# Patient Record
Sex: Female | Born: 1945 | Race: White | Hispanic: No | Marital: Single | Smoking: Never smoker
Health system: Southern US, Community
[De-identification: ages and names within clinical notes are randomized; demographics above are authoritative.]

## PROBLEM LIST (undated history)

## (undated) DIAGNOSIS — T7840XA Allergy, unspecified, initial encounter: Secondary | ICD-10-CM

## (undated) DIAGNOSIS — C801 Malignant (primary) neoplasm, unspecified: Secondary | ICD-10-CM

## (undated) DIAGNOSIS — K921 Melena: Secondary | ICD-10-CM

## (undated) DIAGNOSIS — Z923 Personal history of irradiation: Secondary | ICD-10-CM

## (undated) DIAGNOSIS — Z9221 Personal history of antineoplastic chemotherapy: Secondary | ICD-10-CM

## (undated) HISTORY — DX: Allergy, unspecified, initial encounter: T78.40XA

## (undated) HISTORY — DX: Melena: K92.1

## (undated) HISTORY — DX: Malignant (primary) neoplasm, unspecified: C80.1

---

## 2001-08-25 ENCOUNTER — Other Ambulatory Visit: Admission: RE | Admit: 2001-08-25 | Discharge: 2001-08-25 | Payer: Self-pay | Admitting: Obstetrics and Gynecology

## 2001-11-10 ENCOUNTER — Other Ambulatory Visit: Admission: RE | Admit: 2001-11-10 | Discharge: 2001-11-10 | Payer: Self-pay | Admitting: Obstetrics and Gynecology

## 2001-11-27 ENCOUNTER — Encounter (INDEPENDENT_AMBULATORY_CARE_PROVIDER_SITE_OTHER): Payer: Self-pay

## 2001-11-27 ENCOUNTER — Ambulatory Visit (HOSPITAL_COMMUNITY): Admission: RE | Admit: 2001-11-27 | Discharge: 2001-11-27 | Payer: Self-pay | Admitting: Obstetrics and Gynecology

## 2002-04-14 ENCOUNTER — Other Ambulatory Visit: Admission: RE | Admit: 2002-04-14 | Discharge: 2002-04-14 | Payer: Self-pay | Admitting: Obstetrics and Gynecology

## 2002-10-26 ENCOUNTER — Other Ambulatory Visit: Admission: RE | Admit: 2002-10-26 | Discharge: 2002-10-26 | Payer: Self-pay | Admitting: Obstetrics and Gynecology

## 2002-12-10 DIAGNOSIS — C801 Malignant (primary) neoplasm, unspecified: Secondary | ICD-10-CM

## 2002-12-10 HISTORY — DX: Malignant (primary) neoplasm, unspecified: C80.1

## 2002-12-10 HISTORY — PX: MASTECTOMY: SHX3

## 2003-03-02 ENCOUNTER — Encounter: Payer: Self-pay | Admitting: Obstetrics and Gynecology

## 2003-03-02 ENCOUNTER — Encounter (INDEPENDENT_AMBULATORY_CARE_PROVIDER_SITE_OTHER): Payer: Self-pay | Admitting: Specialist

## 2003-03-02 ENCOUNTER — Encounter: Admission: RE | Admit: 2003-03-02 | Discharge: 2003-03-02 | Payer: Self-pay | Admitting: Obstetrics and Gynecology

## 2003-03-02 ENCOUNTER — Other Ambulatory Visit: Admission: RE | Admit: 2003-03-02 | Discharge: 2003-03-02 | Payer: Self-pay | Admitting: Diagnostic Radiology

## 2003-03-05 ENCOUNTER — Encounter: Payer: Self-pay | Admitting: General Surgery

## 2003-03-05 ENCOUNTER — Encounter (HOSPITAL_COMMUNITY): Admission: RE | Admit: 2003-03-05 | Discharge: 2003-06-03 | Payer: Self-pay | Admitting: General Surgery

## 2003-03-08 ENCOUNTER — Encounter: Payer: Self-pay | Admitting: General Surgery

## 2003-03-12 ENCOUNTER — Encounter: Admission: RE | Admit: 2003-03-12 | Discharge: 2003-03-12 | Payer: Self-pay | Admitting: General Surgery

## 2003-03-12 ENCOUNTER — Encounter: Payer: Self-pay | Admitting: General Surgery

## 2003-03-15 ENCOUNTER — Encounter (INDEPENDENT_AMBULATORY_CARE_PROVIDER_SITE_OTHER): Payer: Self-pay | Admitting: *Deleted

## 2003-03-15 ENCOUNTER — Encounter: Payer: Self-pay | Admitting: General Surgery

## 2003-03-15 ENCOUNTER — Ambulatory Visit (HOSPITAL_BASED_OUTPATIENT_CLINIC_OR_DEPARTMENT_OTHER): Admission: RE | Admit: 2003-03-15 | Discharge: 2003-03-15 | Payer: Self-pay | Admitting: General Surgery

## 2003-03-22 ENCOUNTER — Ambulatory Visit (HOSPITAL_BASED_OUTPATIENT_CLINIC_OR_DEPARTMENT_OTHER): Admission: RE | Admit: 2003-03-22 | Discharge: 2003-03-23 | Payer: Self-pay | Admitting: General Surgery

## 2003-03-22 ENCOUNTER — Encounter (INDEPENDENT_AMBULATORY_CARE_PROVIDER_SITE_OTHER): Payer: Self-pay | Admitting: Specialist

## 2003-04-07 ENCOUNTER — Encounter: Payer: Self-pay | Admitting: Oncology

## 2003-04-07 ENCOUNTER — Ambulatory Visit (HOSPITAL_COMMUNITY): Admission: RE | Admit: 2003-04-07 | Discharge: 2003-04-07 | Payer: Self-pay | Admitting: Oncology

## 2003-04-12 ENCOUNTER — Ambulatory Visit (HOSPITAL_COMMUNITY): Admission: RE | Admit: 2003-04-12 | Discharge: 2003-04-12 | Payer: Self-pay | Admitting: Oncology

## 2003-04-12 ENCOUNTER — Encounter: Payer: Self-pay | Admitting: Oncology

## 2003-04-12 ENCOUNTER — Encounter (INDEPENDENT_AMBULATORY_CARE_PROVIDER_SITE_OTHER): Payer: Self-pay | Admitting: *Deleted

## 2003-04-19 ENCOUNTER — Ambulatory Visit (HOSPITAL_BASED_OUTPATIENT_CLINIC_OR_DEPARTMENT_OTHER): Admission: RE | Admit: 2003-04-19 | Discharge: 2003-04-19 | Payer: Self-pay | Admitting: General Surgery

## 2003-04-19 ENCOUNTER — Encounter: Payer: Self-pay | Admitting: General Surgery

## 2003-04-23 ENCOUNTER — Inpatient Hospital Stay (HOSPITAL_COMMUNITY): Admission: RE | Admit: 2003-04-23 | Discharge: 2003-04-26 | Payer: Self-pay | Admitting: Oncology

## 2003-04-23 ENCOUNTER — Encounter: Payer: Self-pay | Admitting: Surgery

## 2003-04-23 ENCOUNTER — Encounter: Payer: Self-pay | Admitting: Oncology

## 2003-04-24 ENCOUNTER — Encounter: Payer: Self-pay | Admitting: Surgery

## 2003-04-25 ENCOUNTER — Encounter: Payer: Self-pay | Admitting: Surgery

## 2003-04-26 ENCOUNTER — Encounter: Payer: Self-pay | Admitting: Surgery

## 2003-04-28 ENCOUNTER — Encounter: Payer: Self-pay | Admitting: General Surgery

## 2003-04-28 ENCOUNTER — Ambulatory Visit (HOSPITAL_COMMUNITY): Admission: RE | Admit: 2003-04-28 | Discharge: 2003-04-28 | Payer: Self-pay | Admitting: General Surgery

## 2003-05-03 ENCOUNTER — Encounter: Payer: Self-pay | Admitting: Oncology

## 2003-05-03 ENCOUNTER — Ambulatory Visit (HOSPITAL_COMMUNITY): Admission: RE | Admit: 2003-05-03 | Discharge: 2003-05-03 | Payer: Self-pay | Admitting: Oncology

## 2003-06-11 ENCOUNTER — Encounter (HOSPITAL_COMMUNITY): Admission: RE | Admit: 2003-06-11 | Discharge: 2003-09-09 | Payer: Self-pay | Admitting: Oncology

## 2003-06-17 ENCOUNTER — Ambulatory Visit (HOSPITAL_COMMUNITY): Admission: RE | Admit: 2003-06-17 | Discharge: 2003-06-17 | Payer: Self-pay | Admitting: Oncology

## 2003-06-17 ENCOUNTER — Encounter: Payer: Self-pay | Admitting: Oncology

## 2003-06-18 ENCOUNTER — Inpatient Hospital Stay (HOSPITAL_COMMUNITY): Admission: EM | Admit: 2003-06-18 | Discharge: 2003-06-22 | Payer: Self-pay | Admitting: Oncology

## 2003-06-20 ENCOUNTER — Encounter: Payer: Self-pay | Admitting: Oncology

## 2003-06-21 ENCOUNTER — Encounter: Payer: Self-pay | Admitting: Oncology

## 2003-07-19 ENCOUNTER — Ambulatory Visit (HOSPITAL_COMMUNITY): Admission: RE | Admit: 2003-07-19 | Discharge: 2003-07-19 | Payer: Self-pay | Admitting: Oncology

## 2003-07-19 ENCOUNTER — Encounter: Payer: Self-pay | Admitting: Oncology

## 2003-10-22 ENCOUNTER — Ambulatory Visit: Admission: RE | Admit: 2003-10-22 | Discharge: 2004-01-07 | Payer: Self-pay | Admitting: *Deleted

## 2004-01-21 ENCOUNTER — Ambulatory Visit (HOSPITAL_COMMUNITY): Admission: RE | Admit: 2004-01-21 | Discharge: 2004-01-21 | Payer: Self-pay | Admitting: Oncology

## 2004-02-04 ENCOUNTER — Ambulatory Visit: Admission: RE | Admit: 2004-02-04 | Discharge: 2004-02-04 | Payer: Self-pay | Admitting: *Deleted

## 2004-02-09 ENCOUNTER — Other Ambulatory Visit: Admission: RE | Admit: 2004-02-09 | Discharge: 2004-02-09 | Payer: Self-pay | Admitting: Obstetrics and Gynecology

## 2004-02-18 ENCOUNTER — Ambulatory Visit: Admission: RE | Admit: 2004-02-18 | Discharge: 2004-02-18 | Payer: Self-pay | Admitting: *Deleted

## 2004-03-02 ENCOUNTER — Encounter: Admission: RE | Admit: 2004-03-02 | Discharge: 2004-03-02 | Payer: Self-pay | Admitting: General Surgery

## 2004-04-04 ENCOUNTER — Encounter: Admission: RE | Admit: 2004-04-04 | Discharge: 2004-04-04 | Payer: Self-pay | Admitting: General Surgery

## 2004-08-15 ENCOUNTER — Ambulatory Visit (HOSPITAL_COMMUNITY): Admission: RE | Admit: 2004-08-15 | Discharge: 2004-08-15 | Payer: Self-pay | Admitting: Family Medicine

## 2005-01-18 ENCOUNTER — Ambulatory Visit: Payer: Self-pay | Admitting: Oncology

## 2005-03-21 ENCOUNTER — Other Ambulatory Visit: Admission: RE | Admit: 2005-03-21 | Discharge: 2005-03-21 | Payer: Self-pay | Admitting: Obstetrics and Gynecology

## 2005-03-28 ENCOUNTER — Encounter: Admission: RE | Admit: 2005-03-28 | Discharge: 2005-03-28 | Payer: Self-pay | Admitting: General Surgery

## 2005-06-18 ENCOUNTER — Ambulatory Visit: Payer: Self-pay | Admitting: Oncology

## 2005-06-21 ENCOUNTER — Ambulatory Visit (HOSPITAL_COMMUNITY): Admission: RE | Admit: 2005-06-21 | Discharge: 2005-06-21 | Payer: Self-pay | Admitting: Oncology

## 2005-06-22 ENCOUNTER — Ambulatory Visit (HOSPITAL_COMMUNITY): Admission: RE | Admit: 2005-06-22 | Discharge: 2005-06-22 | Payer: Self-pay | Admitting: Oncology

## 2005-08-31 ENCOUNTER — Ambulatory Visit: Payer: Self-pay | Admitting: Oncology

## 2005-09-05 ENCOUNTER — Ambulatory Visit (HOSPITAL_COMMUNITY): Admission: RE | Admit: 2005-09-05 | Discharge: 2005-09-05 | Payer: Self-pay | Admitting: Oncology

## 2005-11-15 ENCOUNTER — Encounter: Admission: RE | Admit: 2005-11-15 | Discharge: 2005-11-15 | Payer: Self-pay | Admitting: General Surgery

## 2006-03-15 ENCOUNTER — Ambulatory Visit: Payer: Self-pay | Admitting: Oncology

## 2006-03-18 LAB — COMPREHENSIVE METABOLIC PANEL
ALT: 18 U/L (ref 0–40)
AST: 22 U/L (ref 0–37)
Alkaline Phosphatase: 43 U/L (ref 39–117)
Glucose, Bld: 91 mg/dL (ref 70–99)
Sodium: 137 mEq/L (ref 135–145)
Total Bilirubin: 0.3 mg/dL (ref 0.3–1.2)
Total Protein: 7 g/dL (ref 6.0–8.3)

## 2006-03-18 LAB — CBC WITH DIFFERENTIAL/PLATELET
BASO%: 0.5 % (ref 0.0–2.0)
EOS%: 4.1 % (ref 0.0–7.0)
LYMPH%: 23.9 % (ref 14.0–48.0)
MCH: 31.4 pg (ref 26.0–34.0)
MCHC: 33 g/dL (ref 32.0–36.0)
MCV: 95.2 fL (ref 81.0–101.0)
MONO%: 10.2 % (ref 0.0–13.0)
Platelets: 256 10*3/uL (ref 145–400)
RBC: 3.68 10*6/uL — ABNORMAL LOW (ref 3.70–5.32)

## 2006-03-18 LAB — CANCER ANTIGEN 27.29: CA 27.29: 18 U/mL (ref 0–39)

## 2006-10-10 ENCOUNTER — Ambulatory Visit: Payer: Self-pay | Admitting: Oncology

## 2006-10-14 LAB — CBC WITH DIFFERENTIAL/PLATELET
EOS%: 3.5 % (ref 0.0–7.0)
Eosinophils Absolute: 0.2 10*3/uL (ref 0.0–0.5)
HGB: 11.7 g/dL (ref 11.6–15.9)
MCH: 32 pg (ref 26.0–34.0)
MCV: 95.4 fL (ref 81.0–101.0)
MONO%: 13.6 % — ABNORMAL HIGH (ref 0.0–13.0)
NEUT#: 3.1 10*3/uL (ref 1.5–6.5)
RBC: 3.66 10*6/uL — ABNORMAL LOW (ref 3.70–5.32)
RDW: 13 % (ref 11.3–14.5)
lymph#: 1.6 10*3/uL (ref 0.9–3.3)

## 2006-10-14 LAB — COMPREHENSIVE METABOLIC PANEL
ALT: 17 U/L (ref 0–35)
Albumin: 3.9 g/dL (ref 3.5–5.2)
Alkaline Phosphatase: 44 U/L (ref 39–117)
Potassium: 4.3 mEq/L (ref 3.5–5.3)
Sodium: 140 mEq/L (ref 135–145)
Total Bilirubin: 0.3 mg/dL (ref 0.3–1.2)
Total Protein: 6.6 g/dL (ref 6.0–8.3)

## 2006-10-14 LAB — CANCER ANTIGEN 27.29: CA 27.29: 13 U/mL (ref 0–39)

## 2006-11-08 ENCOUNTER — Ambulatory Visit (HOSPITAL_COMMUNITY): Admission: RE | Admit: 2006-11-08 | Discharge: 2006-11-08 | Payer: Self-pay | Admitting: Oncology

## 2006-11-18 ENCOUNTER — Encounter: Admission: RE | Admit: 2006-11-18 | Discharge: 2006-11-18 | Payer: Self-pay | Admitting: General Surgery

## 2007-04-10 ENCOUNTER — Ambulatory Visit: Payer: Self-pay | Admitting: Oncology

## 2007-04-14 LAB — COMPREHENSIVE METABOLIC PANEL
BUN: 11 mg/dL (ref 6–23)
CO2: 26 mEq/L (ref 19–32)
Calcium: 8.4 mg/dL (ref 8.4–10.5)
Chloride: 107 mEq/L (ref 96–112)
Creatinine, Ser: 0.72 mg/dL (ref 0.40–1.20)
Glucose, Bld: 125 mg/dL — ABNORMAL HIGH (ref 70–99)

## 2007-04-14 LAB — CBC WITH DIFFERENTIAL/PLATELET
Basophils Absolute: 0 10*3/uL (ref 0.0–0.1)
HCT: 32.7 % — ABNORMAL LOW (ref 34.8–46.6)
HGB: 11.2 g/dL — ABNORMAL LOW (ref 11.6–15.9)
MONO#: 0.6 10*3/uL (ref 0.1–0.9)
NEUT%: 59.8 % (ref 39.6–76.8)
Platelets: 238 10*3/uL (ref 145–400)
WBC: 5.3 10*3/uL (ref 3.9–10.0)
lymph#: 1.3 10*3/uL (ref 0.9–3.3)

## 2007-04-14 LAB — CANCER ANTIGEN 27.29: CA 27.29: 19 U/mL (ref 0–39)

## 2007-10-09 ENCOUNTER — Ambulatory Visit: Payer: Self-pay | Admitting: Oncology

## 2007-10-13 LAB — COMPREHENSIVE METABOLIC PANEL
Alkaline Phosphatase: 46 U/L (ref 39–117)
BUN: 18 mg/dL (ref 6–23)
CO2: 25 mEq/L (ref 19–32)
Creatinine, Ser: 0.99 mg/dL (ref 0.40–1.20)
Glucose, Bld: 92 mg/dL (ref 70–99)
Total Bilirubin: 0.3 mg/dL (ref 0.3–1.2)
Total Protein: 7.1 g/dL (ref 6.0–8.3)

## 2007-10-13 LAB — FOLLICLE STIMULATING HORMONE: FSH: 24.1 m[IU]/mL

## 2007-10-13 LAB — CBC WITH DIFFERENTIAL/PLATELET
Basophils Absolute: 0 10*3/uL (ref 0.0–0.1)
EOS%: 3 % (ref 0.0–7.0)
Eosinophils Absolute: 0.2 10*3/uL (ref 0.0–0.5)
HGB: 11.9 g/dL (ref 11.6–15.9)
MCH: 32.4 pg (ref 26.0–34.0)
NEUT#: 3.2 10*3/uL (ref 1.5–6.5)
RDW: 12.6 % (ref 11.3–14.5)
WBC: 5.7 10*3/uL (ref 3.9–10.0)
lymph#: 1.6 10*3/uL (ref 0.9–3.3)

## 2007-10-13 LAB — CANCER ANTIGEN 27.29: CA 27.29: 16 U/mL (ref 0–39)

## 2007-10-23 LAB — ESTRADIOL, ULTRA SENS: Estradiol, Ultra Sensitive: 7 pg/mL

## 2007-11-20 ENCOUNTER — Encounter: Admission: RE | Admit: 2007-11-20 | Discharge: 2007-11-20 | Payer: Self-pay | Admitting: General Surgery

## 2008-01-13 IMAGING — CR DG CHEST 2V
2 series · 2 of 2 positions shown · non-contrast
Comparison: 06/22/2005

CLINICAL DATA: Breast cancer, left scapular pain

CHEST - 2 VIEW:

[view not recorded (1 of 2)]
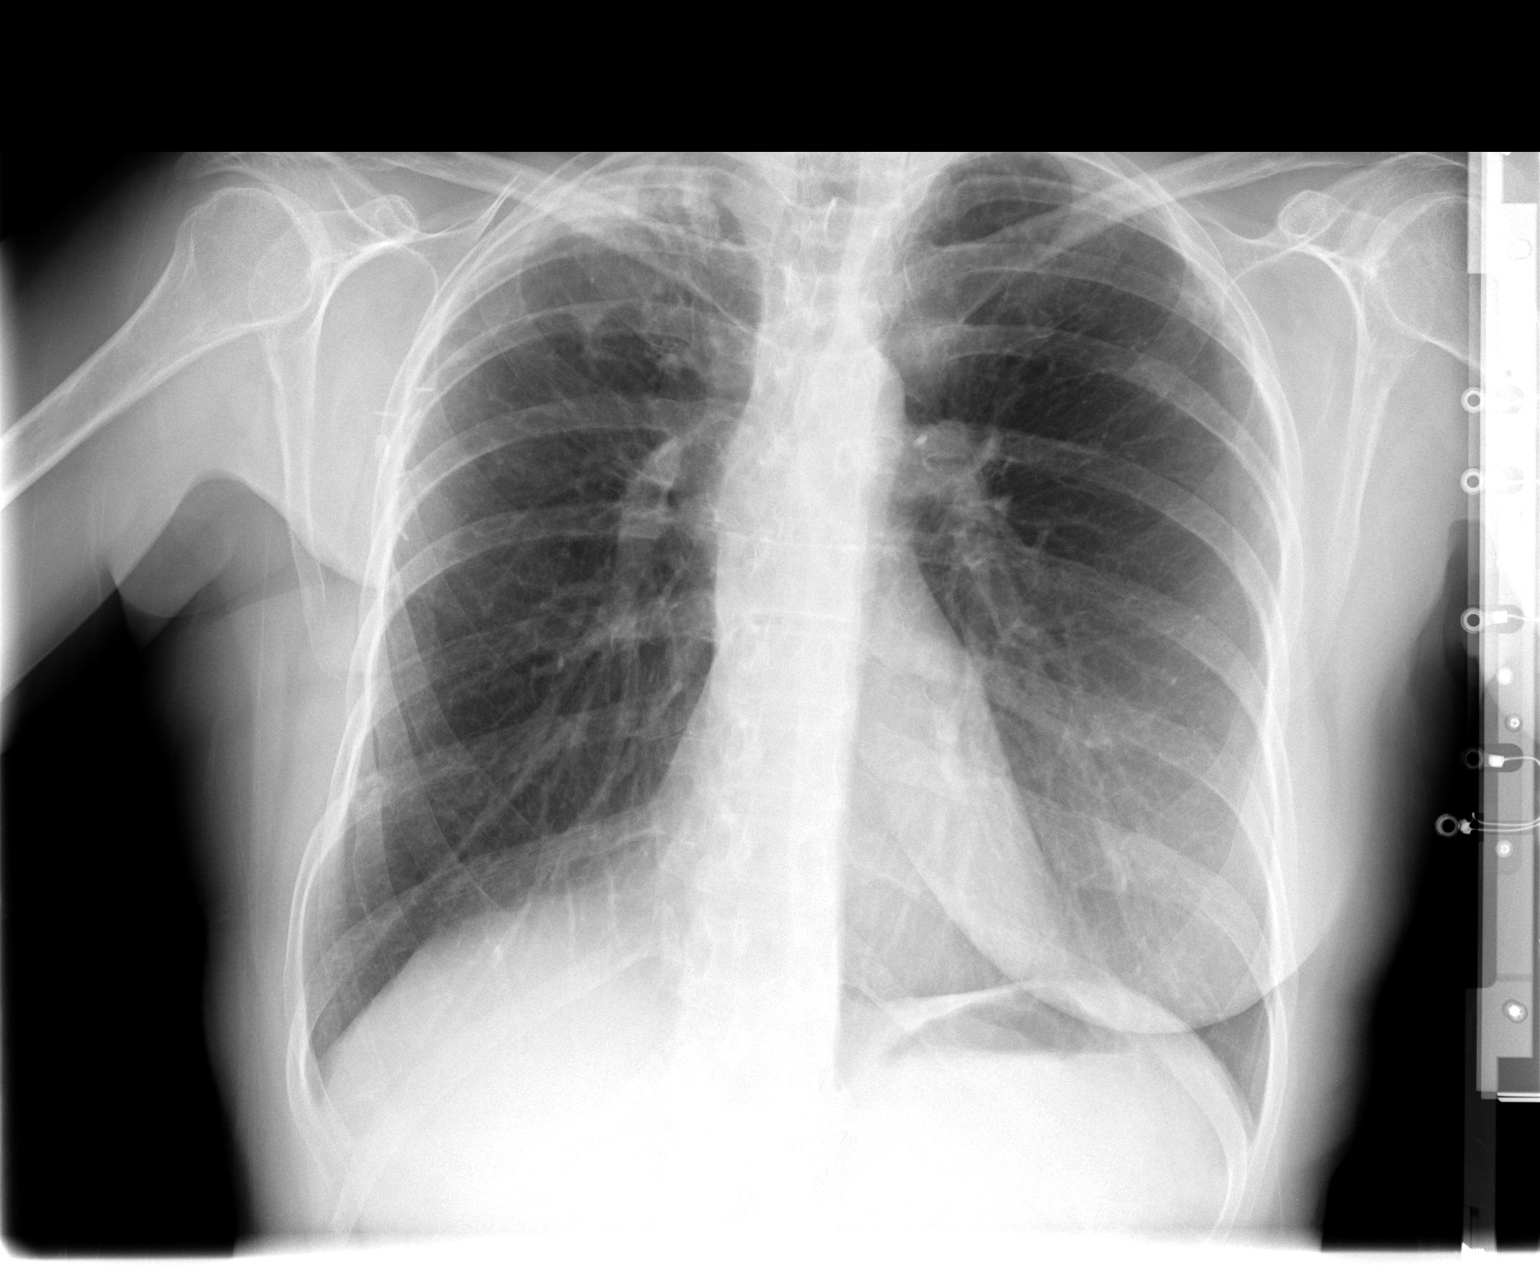

[view not recorded (2 of 2)]
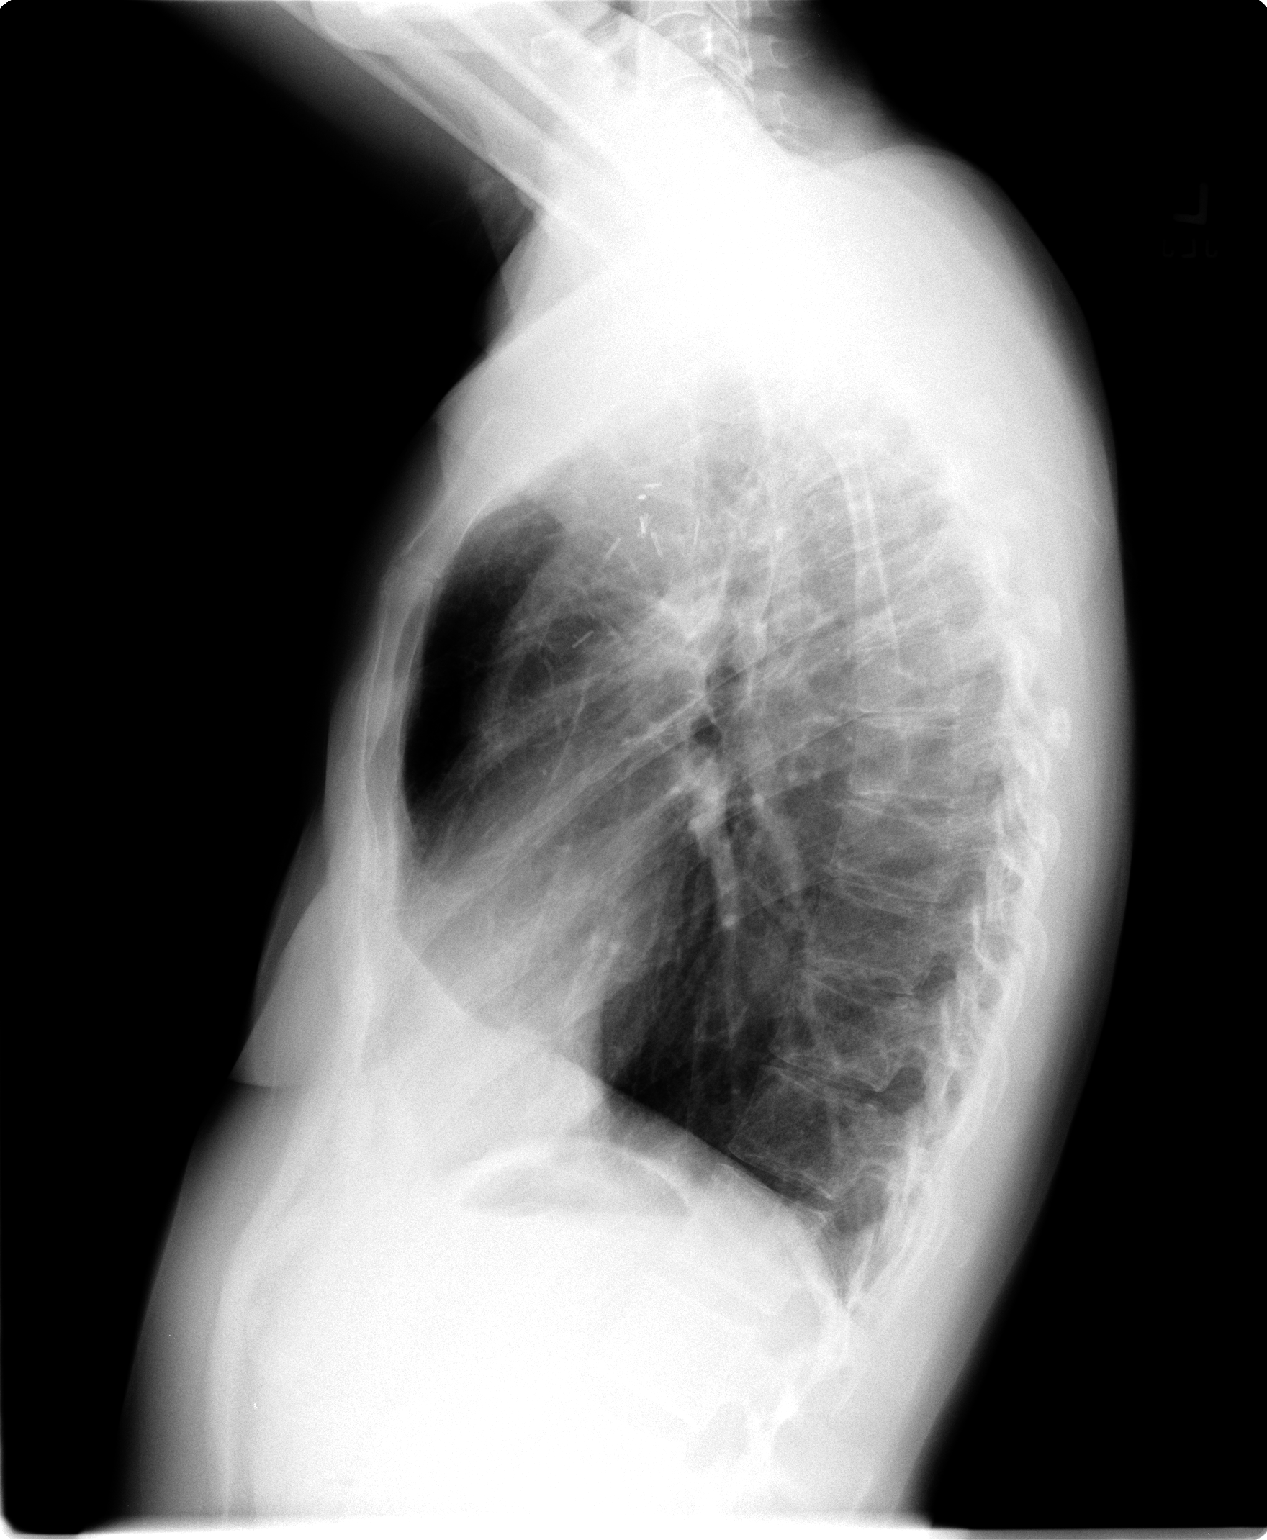

[2 of 2 positions shown; findings below may reference images not displayed]

FINDINGS: Heart and mediastinal contours are within normal limits. There is
stable right apical density, likely scarring. There appears to be mild
retraction of the hila superiorly. Surgical clips are noted in the right axilla.
No acute findings. Visualized to the area
IMPRESSION: Chronic changes. No acute findings.

## 2008-03-02 ENCOUNTER — Encounter (INDEPENDENT_AMBULATORY_CARE_PROVIDER_SITE_OTHER): Payer: Self-pay | Admitting: Family Medicine

## 2008-03-02 ENCOUNTER — Ambulatory Visit (HOSPITAL_COMMUNITY): Admission: RE | Admit: 2008-03-02 | Discharge: 2008-03-02 | Payer: Self-pay | Admitting: Obstetrics and Gynecology

## 2008-03-02 ENCOUNTER — Ambulatory Visit: Payer: Self-pay | Admitting: Vascular Surgery

## 2008-04-07 ENCOUNTER — Ambulatory Visit: Payer: Self-pay | Admitting: Oncology

## 2008-04-12 LAB — CBC WITH DIFFERENTIAL/PLATELET
Basophils Absolute: 0.1 10*3/uL (ref 0.0–0.1)
EOS%: 1.9 % (ref 0.0–7.0)
Eosinophils Absolute: 0.1 10*3/uL (ref 0.0–0.5)
HCT: 35.7 % (ref 34.8–46.6)
HGB: 12.2 g/dL (ref 11.6–15.9)
MCH: 32.3 pg (ref 26.0–34.0)
MCV: 94.7 fL (ref 81.0–101.0)
MONO%: 8.3 % (ref 0.0–13.0)
NEUT#: 4.4 10*3/uL (ref 1.5–6.5)
NEUT%: 61.8 % (ref 39.6–76.8)
Platelets: 274 10*3/uL (ref 145–400)
RDW: 12.7 % (ref 11.3–14.5)

## 2008-04-13 LAB — COMPREHENSIVE METABOLIC PANEL
AST: 21 U/L (ref 0–37)
Albumin: 4 g/dL (ref 3.5–5.2)
Alkaline Phosphatase: 39 U/L (ref 39–117)
BUN: 15 mg/dL (ref 6–23)
Calcium: 8.9 mg/dL (ref 8.4–10.5)
Creatinine, Ser: 1.03 mg/dL (ref 0.40–1.20)
Glucose, Bld: 115 mg/dL — ABNORMAL HIGH (ref 70–99)
Potassium: 4 mEq/L (ref 3.5–5.3)

## 2008-04-13 LAB — CANCER ANTIGEN 27.29: CA 27.29: 20 U/mL (ref 0–39)

## 2008-04-30 ENCOUNTER — Encounter: Admission: RE | Admit: 2008-04-30 | Discharge: 2008-04-30 | Payer: Self-pay | Admitting: Oncology

## 2008-10-14 ENCOUNTER — Ambulatory Visit: Payer: Self-pay | Admitting: Oncology

## 2008-10-18 LAB — CBC WITH DIFFERENTIAL/PLATELET
BASO%: 0.6 % (ref 0.0–2.0)
Basophils Absolute: 0 10*3/uL (ref 0.0–0.1)
EOS%: 4 % (ref 0.0–7.0)
HCT: 35.4 % (ref 34.8–46.6)
MCH: 32.2 pg (ref 26.0–34.0)
MCHC: 33.5 g/dL (ref 32.0–36.0)
MCV: 96.1 fL (ref 81.0–101.0)
MONO%: 10.1 % (ref 0.0–13.0)
NEUT%: 57.4 % (ref 39.6–76.8)
lymph#: 1.5 10*3/uL (ref 0.9–3.3)

## 2008-10-18 LAB — COMPREHENSIVE METABOLIC PANEL
ALT: 14 U/L (ref 0–35)
AST: 19 U/L (ref 0–37)
Alkaline Phosphatase: 49 U/L (ref 39–117)
BUN: 13 mg/dL (ref 6–23)
Chloride: 103 mEq/L (ref 96–112)
Creatinine, Ser: 0.8 mg/dL (ref 0.40–1.20)
Total Bilirubin: 0.3 mg/dL (ref 0.3–1.2)

## 2008-11-22 ENCOUNTER — Encounter: Admission: RE | Admit: 2008-11-22 | Discharge: 2008-11-22 | Payer: Self-pay | Admitting: Oncology

## 2009-03-25 ENCOUNTER — Ambulatory Visit: Payer: Self-pay | Admitting: Oncology

## 2009-03-29 LAB — CBC WITH DIFFERENTIAL/PLATELET
BASO%: 0.4 % (ref 0.0–2.0)
EOS%: 1.8 % (ref 0.0–7.0)
MCH: 31.8 pg (ref 25.1–34.0)
MCHC: 33.3 g/dL (ref 31.5–36.0)
MONO%: 10.4 % (ref 0.0–14.0)
RDW: 13.5 % (ref 11.2–14.5)
lymph#: 1.7 10*3/uL (ref 0.9–3.3)

## 2009-03-29 LAB — COMPREHENSIVE METABOLIC PANEL
ALT: 14 U/L (ref 0–35)
AST: 16 U/L (ref 0–37)
Albumin: 4.1 g/dL (ref 3.5–5.2)
Alkaline Phosphatase: 66 U/L (ref 39–117)
Calcium: 9.1 mg/dL (ref 8.4–10.5)
Chloride: 102 mEq/L (ref 96–112)
Creatinine, Ser: 1.01 mg/dL (ref 0.40–1.20)
Potassium: 4.1 mEq/L (ref 3.5–5.3)

## 2009-11-23 ENCOUNTER — Encounter: Admission: RE | Admit: 2009-11-23 | Discharge: 2009-11-23 | Payer: Self-pay | Admitting: Oncology

## 2010-03-27 ENCOUNTER — Ambulatory Visit: Payer: Self-pay | Admitting: Oncology

## 2010-03-29 LAB — CBC WITH DIFFERENTIAL/PLATELET
Basophils Absolute: 0 10*3/uL (ref 0.0–0.1)
Eosinophils Absolute: 0.2 10*3/uL (ref 0.0–0.5)
HGB: 12.1 g/dL (ref 11.6–15.9)
LYMPH%: 32.8 % (ref 14.0–49.7)
MCV: 94.9 fL (ref 79.5–101.0)
MONO%: 14.4 % — ABNORMAL HIGH (ref 0.0–14.0)
NEUT#: 3 10*3/uL (ref 1.5–6.5)
NEUT%: 49.3 % (ref 38.4–76.8)
Platelets: 295 10*3/uL (ref 145–400)

## 2010-03-30 LAB — COMPREHENSIVE METABOLIC PANEL
Albumin: 3.9 g/dL (ref 3.5–5.2)
Alkaline Phosphatase: 84 U/L (ref 39–117)
BUN: 15 mg/dL (ref 6–23)
Creatinine, Ser: 0.89 mg/dL (ref 0.40–1.20)
Glucose, Bld: 111 mg/dL — ABNORMAL HIGH (ref 70–99)
Total Bilirubin: 0.3 mg/dL (ref 0.3–1.2)

## 2010-03-30 LAB — VITAMIN D 25 HYDROXY (VIT D DEFICIENCY, FRACTURES): Vit D, 25-Hydroxy: 50 ng/mL (ref 30–89)

## 2010-11-27 ENCOUNTER — Encounter
Admission: RE | Admit: 2010-11-27 | Discharge: 2010-11-27 | Payer: Self-pay | Source: Home / Self Care | Attending: Oncology | Admitting: Oncology

## 2011-03-29 ENCOUNTER — Encounter (HOSPITAL_BASED_OUTPATIENT_CLINIC_OR_DEPARTMENT_OTHER): Payer: 59 | Admitting: Oncology

## 2011-03-29 ENCOUNTER — Other Ambulatory Visit: Payer: Self-pay | Admitting: Oncology

## 2011-03-29 DIAGNOSIS — C50419 Malignant neoplasm of upper-outer quadrant of unspecified female breast: Secondary | ICD-10-CM

## 2011-03-29 LAB — CBC WITH DIFFERENTIAL/PLATELET
BASO%: 0.6 % (ref 0.0–2.0)
HCT: 37 % (ref 34.8–46.6)
HGB: 11.8 g/dL (ref 11.6–15.9)
MCHC: 31.9 g/dL (ref 31.5–36.0)
MONO#: 0.6 10*3/uL (ref 0.1–0.9)
NEUT%: 52.8 % (ref 38.4–76.8)
WBC: 6.4 10*3/uL (ref 3.9–10.3)
lymph#: 2.2 10*3/uL (ref 0.9–3.3)

## 2011-03-30 LAB — COMPREHENSIVE METABOLIC PANEL
ALT: 16 U/L (ref 0–35)
Albumin: 4.2 g/dL (ref 3.5–5.2)
CO2: 24 mEq/L (ref 19–32)
Calcium: 9.3 mg/dL (ref 8.4–10.5)
Chloride: 104 mEq/L (ref 96–112)
Creatinine, Ser: 0.84 mg/dL (ref 0.40–1.20)
Sodium: 139 mEq/L (ref 135–145)
Total Protein: 7 g/dL (ref 6.0–8.3)

## 2011-03-30 LAB — CANCER ANTIGEN 27.29: CA 27.29: 23 U/mL (ref 0–39)

## 2011-04-16 ENCOUNTER — Encounter (HOSPITAL_BASED_OUTPATIENT_CLINIC_OR_DEPARTMENT_OTHER): Payer: 59 | Admitting: Oncology

## 2011-04-16 ENCOUNTER — Other Ambulatory Visit: Payer: Self-pay | Admitting: Oncology

## 2011-04-16 DIAGNOSIS — C50419 Malignant neoplasm of upper-outer quadrant of unspecified female breast: Secondary | ICD-10-CM

## 2011-04-16 DIAGNOSIS — Z853 Personal history of malignant neoplasm of breast: Secondary | ICD-10-CM

## 2011-04-18 ENCOUNTER — Encounter: Payer: Self-pay | Admitting: Family Medicine

## 2011-04-18 ENCOUNTER — Ambulatory Visit (INDEPENDENT_AMBULATORY_CARE_PROVIDER_SITE_OTHER): Payer: 59 | Admitting: Family Medicine

## 2011-04-18 VITALS — BP 120/78 | HR 80 | Temp 99.0°F | Resp 12 | Ht 62.5 in | Wt 117.0 lb

## 2011-04-18 DIAGNOSIS — Z0000 Encounter for general adult medical examination without abnormal findings: Secondary | ICD-10-CM

## 2011-04-18 DIAGNOSIS — C50919 Malignant neoplasm of unspecified site of unspecified female breast: Secondary | ICD-10-CM

## 2011-04-18 LAB — LIPID PANEL
HDL: 53.8 mg/dL (ref >39.00)
Total CHOL/HDL Ratio: 4
VLDL: 11.4 mg/dL (ref 0.0–40.0)

## 2011-04-18 LAB — LDL CHOLESTEROL, DIRECT: Direct LDL: 147.5 mg/dL

## 2011-04-18 LAB — TSH: TSH: 2.61 u[IU]/mL (ref 0.35–5.50)

## 2011-04-18 NOTE — Patient Instructions (Signed)
Check with insurance coverage for Shingles Vaccine. You need to consider Tdap vaccine.

## 2011-04-18 NOTE — Progress Notes (Signed)
  Subjective:    Patient ID: Adriana Hill, female    DOB: [birth year], 65 y.o.   MRN: [id number]  HPI Patient is seen to establish care and for wellness visit. Past medical history reviewed. She had breast cancer with right mastectomy along with chemotherapy and radiation therapy in 2004. Regular followup with oncology since then. She has no other chronic medical problems. She takes Femara and this followed treatment with tamoxifen for 5 years. She sees a gynecologist and had mammogram and Pap smear last December which were normal.  She is adopted so family history is unknown. 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 Nonsmoker. Occasional alcohol use. Exercises couple days per week. Last tetanus unknown. No history of shingles vaccine. Brings copy of labs done her oncologist recently including comprehensive metabolic panel and CBC these were normal. No recent lipids. Takes calcium and vitamin D. Recent vitamin D level 42   Review of Systems  Constitutional: Negative for fever, activity change, appetite change and fatigue.  HENT: Negative for hearing loss, ear pain, sore throat and trouble swallowing.   Eyes: Negative for visual disturbance.  Respiratory: Negative for cough and shortness of breath.   Cardiovascular: Negative for chest pain and palpitations.  Gastrointestinal: Negative for abdominal pain, diarrhea, constipation and blood in stool.  Genitourinary: Negative for dysuria and hematuria.  Musculoskeletal: Negative for myalgias, back pain and arthralgias.  Skin: Negative for rash.  Neurological: Negative for dizziness, syncope and headaches.  Hematological: Negative for adenopathy.  Psychiatric/Behavioral: Negative for confusion and dysphoric mood.       Objective:   Physical Exam  Constitutional: She is oriented to person, place, and time. She appears well-developed and well-nourished.  HENT:  Head: Normocephalic and atraumatic.  Right Ear: External ear  normal.  Left Ear: External ear normal.  Mouth/Throat: Oropharynx is clear and moist. No oropharyngeal exudate.  Eyes: EOM are normal. Pupils are equal, round, and reactive to light.  Neck: Normal range of motion. Neck supple. No thyromegaly present.  Cardiovascular: Normal rate, regular rhythm and normal heart sounds.   No murmur heard. Pulmonary/Chest: Breath sounds normal. No respiratory distress. She has no wheezes. She has no rales.  Abdominal: Soft. Bowel sounds are normal. She exhibits no distension and no mass. There is no tenderness. There is no rebound and no guarding.  Genitourinary:       Pelvic exam as per GYN  Musculoskeletal: Normal range of motion. She exhibits no edema.  Lymphadenopathy:    She has no cervical adenopathy.  Neurological: She is alert and oriented to person, place, and time. She displays normal reflexes. No cranial nerve deficit.  Skin: No rash noted.  Psychiatric: She has a normal mood and affect. Her behavior is normal. Judgment and thought content normal.          Assessment & Plan:  Complete physical examination. Patient continues with gynecologic followup. Recommended Tdap and shingles vaccine. She'll check on coverage for shingles vaccine. She wishes to wait on vaccines at this time. Obtain lipid panel and TSH. Discussed calcium and vitamin D intake and regular weightbearing exercise. She has DEXA scan scheduled with GYN next year. Colonoscopy 2009

## 2011-04-19 NOTE — Progress Notes (Signed)
Quick Note:  Pt informed ______ 

## 2011-04-27 NOTE — H&P (Signed)
NAME:  Adriana Hill, Adriana Hill                           ACCOUNT NO.:  [id number]   MEDICAL RECORD NO.:  [id number]                   PATIENT TYPE:  OUT   LOCATION:  XRAY                                 FACILITY[facility]  [facility]   PHYSICIAN:  Valentino Hue. Magrinat, M.D.            DATE OF BIRTH:  Apr 30, 1946   DATE OF ADMISSION:  DATE OF DISCHARGE:                                HISTORY & PHYSICAL   HISTORY OF PRESENT ILLNESS:  The patient is a 65 year old [place] woman  with a history of breast cancer initially diagnosed March of this year and  status post right modified radical mastectomy in April.  She was found to  have four of nine lymph nodes involved for a stage IIIA tumor and is being  treated with dose dense Adriamycin/Cytoxan, last treatment given June 04, 2003.   She was due for her next treatment on July 7 but when she came in she was  found to be febrile with a temperature of greater than 102 degrees.  She was  pan cultured, given Rocephin 2 g IV, and some IV fluids.   Today, however, she feels worse, has a cough which makes her gag, is unable  to take any medications by mouth as a result and therefore we cannot treat  her as an outpatient with oral antibiotics.   It will be much safer in her case to bring her into the hospital and treat  her with intravenous antibiotics as well as provide her with constant  hydration until the current problems are fully evaluated and treated.   PAST MEDICAL HISTORY:  1. 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  2. Possible anxiety reaction.   FAMILY HISTORY:  Uninformative, the patient being adopted.   GYNECOLOGIC HISTORY:  She is G2, F2, P0, A0, L2.  Her last menstrual period  was July of 2003 and she has been having mild hot flashes since that time.   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  She has  been married four years to her husband, Kathlene November, who is vice president of a  Tax adviser.  The patient's daughter, Linton Rump, is 23 and lives in  [place].  A second daughter had severe cerebral palsy and died five years  ago.  Kathlene November has a daughter of his own, Sharyl Nimrod, who lives in [place].   HEALTH MAINTENANCE:  Most recent mammogram was done in April of this year.  She had a colonoscopy December of 2003.  Most recent Pap smear was normal in  November 2003.  There is no history of tobacco or alcohol abuse.  The  patient does not have a living will or health care power of attorney.   ALLERGIES:  FLAGYL, ERYTHROMYCIN, THEO-DUR, and VICODIN.   MEDICATIONS:  She supposedly is on Remeron 15 mg daily, but never started  that medication.  She has  Pepcid at home, Zofran, Phenergan, magic  mouthwash, Tylenol, but has not been able to take anything by mouth, says,  in the last 24 hours because of gagging problems.   REVIEW OF SYSTEMS:  She has a dry cough which is nearly constant and not  currently productive.  She has a sense of discomfort/pain in the right face  area which may be indeed secondary to sinus.  There have been no unusual  headaches, visual changes, frank vomiting, or dizziness.  She does have some  mouth sores which are improving.  Again, she has a dry cough.  No worsening  shortness of breath.  No pleurisy.  No hemoptysis.  No phlegm production.  There has been no diarrhea but she has discomfort in rectal area from  hemorrhoids and anal fissure.  The rest of the review of systems was  noncontributory and in particular there is no focal finding or symptoms  suggestive of source of her fever.   PHYSICAL EXAMINATION:  VITAL SIGNS:  Weight is stable at 106, temperature  102.5, pulse 151, respiratory rate 16, blood pressure 107/79.  HEENT:  The oropharynx has minimal, very shallow ulcerations chiefly in the  right posterior cheek area.  NECK:  I do not palpate any peripheral adenopathy.  LUNGS:  No crackles or wheezes.  HEART:  Regular rate and rhythm.  ABDOMEN:   Benign.  MUSCULOSKELETAL:  No peripheral edema.  NEUROLOGIC:  Nonfocal.   LABORATORIES:  Laboratory work today is pending.   IMPRESSION AND PLAN:  A 65 year old [place] woman with a history of  stage IIIA breast cancer being treated with dose dense CA, last dose June 25  now with fever.   She has a port in place and although the port looks fine we are going to  start her on vancomycin as well as Primaxin.  We have one set of cultures  from yesterday and I will obtain another set of cultures today.  After that  I do not believe she will need any further cultures and I do expect her to  defervesce over the next 48 hours.  If she is comfortable by Monday, July 12  we will proceed with her chemotherapy on that day and probably let her go  either the 13th or 14th once she has recovered from that (she usually  receives intravenous fluids at least one day post chemo).  She will have a  return appointment here on July 21 and the plan will be to initiate weekly  Taxol on July 28.                                               Valentino Hue. Magrinat, M.D.    Ronna Polio  D:  06/18/2003  T:  06/18/2003  Job:  [id number]   cc:   Al Decant. Janey Greaser, M.D.  [address]  [place]  [place] [address]  Fax: [phone]   Rose Phi. Young, M.D.  [address]  [place]  [place] [address]  Fax: [phone]   Malva Limes, M.D.  [address]  [place]  [place] [address]  Fax: [phone]   Llana Aliment. Malon Kindle., M.D.  [address]  [place]  [place] [address]  Fax: [phone]   Sheppard Plumber. Earlene Plater, M.D.  [address]  [place]  [place] [address]  Fax: [phone]

## 2011-04-27 NOTE — Op Note (Signed)
NAME:  Adriana Hill, Adriana Hill                           ACCOUNT NO.:  [id number]   MEDICAL RECORD NO.:  [id number]                   PATIENT TYPE:  AMB   LOCATION:  DSC                                  FACILITY:  [facility]   PHYSICIAN:  Rose Phi. Maple Hudson, M.D.                DATE OF BIRTH:  1946/01/22   DATE OF PROCEDURE:  03/15/2003  DATE OF DISCHARGE:                                 OPERATIVE REPORT   PREOPERATIVE DIAGNOSIS:  Stage 1 carcinoma of the right breast.   POSTOPERATIVE DIAGNOSIS:  Stage 1 carcinoma of the right breast.   OPERATION:  1. Blue dye injection.  2. Right sentinel lymph node biopsy.  3. Right partial mastectomy.   SURGEON:  Rose Phi. Maple Hudson, M.D.   ANESTHESIA:  General.   DESCRIPTION OF PROCEDURE:  Prior to coming  to the operating room one  millicurie of technetium sulfur colloid  was injected intradermally in the  periareolar tissue. After suitable general anesthesia was induced the  patient was placed in the supine position with the right arm extended on the  arm board. Then 2 cc of methylene blue mixed with 3 cc of saline was then  injected into the subareolar tissue and the breast was gently massaged for  about 3 minutes. We then prepped and draped the breast and axilla.   Careful scanning of the breast, the internal mammary area, the  supraclavicular area and the axilla revealed only a hot spot in the right  axilla. A short transverse axillary incision was made  with dissection  through the subcutaneous tissue to the clavipectoral fascia. Just under the  clavipectoral fascia after opening it was a blue and hot node. This was  removed and submitted as the sentinel node. There were no other palpable  blue or hot nodes.   While that was being done a curved incision over the palpable mass at the 10  o'clock position of the right breast was then made, incorporating a wedge of  skin. I then dissected medially which went under the areola and actually  extended much  more medially than had been anticipated. I was very concerned  about the medial margin. I then went superiorly and then inferiorly and then  laterally excising this and then oriented the specimen for the pathologist.  Hemostasis was obtained with the cautery.   I then excised more tissue medially to be submitted as a second specimen for  margins. The pathologist then reported that there was also some cells at the  lateral margin, so I excised some more tissue laterally. I am concerned as  to whether we are going to be able to get clean margins or not.   With good hemostasis we closed the incision with 3-0 Vicryl and the  subcuticular with 4-0 Monocryl and Steri-Strips. Touch prep on the sentinel  node was negative.   After applying the Steri-Strips the  dressings were applied and the patient  was transferred to the recovery room in satisfactory condition. She  tolerated the procedure well.                                               Rose Phi. Maple Hudson, M.D.    PRY/MEDQ  D:  03/15/2003  T:  03/15/2003  Job:  [id number]   cc:   Malva Limes, M.D.  [address]  [place]  [place] [address]  Fax: [phone]   Al Decant. Janey Greaser, M.D.  [address]  [place]  [place] [address]  Fax: [phone]

## 2011-04-27 NOTE — Discharge Summary (Signed)
   NAME:  Adriana Hill, Adriana Hill                           ACCOUNT NO.:  [id number]   MEDICAL RECORD NO.:  [id number]                   PATIENT TYPE:  INP   LOCATION:  0364                                 FACILITY:  [facility]   PHYSICIAN:  Velora Heckler, M.D.                DATE OF BIRTH:  1946-03-25   DATE OF ADMISSION:  04/23/2003  DATE OF DISCHARGE:  04/26/2003                                 DISCHARGE SUMMARY   REASON FOR ADMISSION:  Hemopneumothorax.   BRIEF HISTORY:  The patient is a 65 year old white female who underwent  attempted Port-A-Cath placement on Apr 19, 2003 by Dr. Francina Ames. The  patient subsequently developed left sided chest pain. An x-ray demonstrated  a 100% pneumothorax with effusion. The patient comes to the emergency  department at [facility]   HOSPITAL COURSE:  The patient was seen and evaluated in the emergency  department. A left chest tube was placed with evacuation of approximately  120 mL of bloody fluid. Followup chest x-ray showed reexpansion of the lung.  The patient had nearly complete reexpansion. She was placed to water seal  and then chest tube was removed on May 16. Followup chest x-ray on May 17  showed a 10% residual pneumothorax which remained stable on sequential x-ray  over the course of the day. The patient was discharged home late in the day  Apr 26, 2003.   PLAN:  The patient is discharged home. She will followup in the office with  Dr. Francina Ames at [facility] later this week. A followup  chest x-ray will also be obtained later this week. The patient will call  immediately should she become symptomatic.   FINAL DIAGNOSES:  Left hemopneumothorax following attempted Port-A-Cath  placement.   CONDITION ON DISCHARGE:  Improved.                                               Velora Heckler, M.D.    TMG/MEDQ  D:  05/06/2003  T:  05/06/2003  Job:  [id number]

## 2011-04-27 NOTE — Op Note (Signed)
[facility]  Patient:    Adriana Hill, Adriana Hill Visit Number: [id number] MRN: [id number]          Service Type: DSU Location: [facility] Attending Physician:  Osborn Coho Dictated by:   Janeece Riggers Dareen Piano, M.D. Proc. Date: 11/27/01 Admit Date:  11/27/2001                             Operative Report  PREOPERATIVE DIAGNOSES:       1. Cervical dysplasia.                               2. Inadequate colposcopy.                               3. Patient allergic to NOVOCAINE.  POSTOPERATIVE DIAGNOSES:      1. Cervical dysplasia.                               2. Inadequate colposcopy.                               3. Patient allergic to NOVOCAINE.  PROCEDURE:                    Loop electrosurgical excision procedure.  SURGEON:                      Mark E. Dareen Piano, M.D.  ANESTHESIA:                   MAC with paracervical block.  DRAINS:                       None.  ANTIBIOTICS:                  None.  COMPLICATIONS:                None.  SPECIMEN:                     Cervical cone sent to pathology.  PROCEDURE:                    Patient was taken to the operating room where she was placed in a dorsal supine position.  MAC anesthesia was administered and she was placed in the dorsal lithotomy position and green towels draped on her pelvis.  A sterile speculum was placed in the vagina.  Patient was currently on her menstrual cycle.  Lidocaine 1% 18 cc with epinephrine was injected in the paracervix for a block.  The LEEP was then set at 70 watts for cutting.  A loop was used to excise a cone specimen.  The entire os was included in the specimen.  This was handed off and then an endocervical button was obtained with the loop.  Following this coagulation was performed with a ball using 50 watts.  At the conclusion of the procedure minimal oozing was noted.  Patient tolerated procedure well.  She was taken to the recovery room in stable condition.  She  will be discharged to home.  She will be instructed to follow up in the office  in four weeks.  She will be told to take Advil p.r.n. Dictated by:   Janeece Riggers Dareen Piano, M.D. Attending Physician:  Osborn Coho DD:  11/27/01 TD:  11/28/01 Job: [id number]

## 2011-04-27 NOTE — Op Note (Signed)
   NAME:  Adriana Hill, DAUGHETY                           ACCOUNT NO.:  [id number]   MEDICAL RECORD NO.:  [id number]                   PATIENT TYPE:  INP   LOCATION:  0364                                 FACILITY:  [facility]   PHYSICIAN:  Velora Heckler, M.D.                DATE OF BIRTH:  29-Jan-1946   DATE OF PROCEDURE:  04/23/2003  DATE OF DISCHARGE:                                 OPERATIVE REPORT   PREOPERATIVE DIAGNOSIS:  Left complete pneumothorax, hemothorax.   POSTOPERATIVE DIAGNOSIS:  Left complete pneumothorax, hemothorax.   PROCEDURE:  Placement left thoracostomy tube.   SURGEON:  Velora Heckler, M.D.   ANESTHESIA:  1% local with 2.5 mg Versed intravenous.   ESTIMATED BLOOD LOSS:  Minimal.   PREPARATION:  Betadine.   COMPLICATIONS:  None.   INDICATIONS:  The patient is a 65 year old white female, who had an  attempted infusion port placement four days ago.  She developed chest pain  and shortness of breath.  Chest x-ray today demonstrates complete left  pneumothorax with hemothorax.  The patient now comes to the emergency  department for treatment.   DESCRIPTION OF PROCEDURE:  The procedure is done in the emergency department  at [facility].  The patient is placed in a right lateral  decubitus position.  Then 2.5 mg of Versed is administered intravenously.  Left chest wall is prepped and draped.  Skin is anesthetized with 1%  lidocaine.  A 2 cm incision is made with a #11 blade.  Dissection is carried  down through subcutaneous tissues.  Tissue overlying the 7th rib is  anesthetized with local anesthetic.  Using a Kelly clamp, the left thorax is  entered.  A 28 French chest tube is then inserted and guided towards the  apex.  It is secured with a 0 silk pursestring suture.  Xeroform gauze is  placed around the base of the tube.  Dry gauze dressings are applied, and  the tube is taped securely to the chest wall.  These are placed to Portovac  suction on 20  cm of water suction.  The patient tolerated the procedure  well.                                               Velora Heckler, M.D.    TMG/MEDQ  D:  04/23/2003  T:  04/23/2003  Job:  [id number]

## 2011-04-27 NOTE — Op Note (Signed)
   NAME:  Adriana Hill, Adriana Hill                           ACCOUNT NO.:  [id number]   MEDICAL RECORD NO.:  [id number]                   PATIENT TYPE:  AMB   LOCATION:  DSC                                  FACILITY:  [facility]   PHYSICIAN:  Rose Phi. Maple Hudson, M.D.                DATE OF BIRTH:  1946/03/02   DATE OF PROCEDURE:  04/19/2003  DATE OF DISCHARGE:                                 OPERATIVE REPORT   PREOPERATIVE DIAGNOSIS:  Stage II carcinoma of the right breast.   POSTOPERATIVE DIAGNOSIS:  Stage II carcinoma of the right breast.   PROCEDURE:  Attempted Port-A-Cath placement.   SURGEON:  Rose Phi. Maple Hudson, M.D.   ANESTHESIA:  MAC.   DESCRIPTION OF PROCEDURE:  The patient was placed on the operating table  with a roll between the shoulders and the left upper chest and neck prepped  and draped in the usual fashion.   Under local anesthesia I attempted on a number of occasions to do a left  subclavian puncture and was never able to cannulate the vein and therefore  was unsuccessful.  I asked Kaylyn Layer. Michelle Piper, M.D., of anesthesia, and he tried  on a number of occasions to do an IJ puncture on the left side, and he was  never able to cannulate the IJ either, although he did hit the carotid  artery a couple of times.   She did not develop a hematoma.   We felt like for safety's sake that we would be better off delaying and  repeating another day and maybe have x-ray do this under ultrasound  guidance.  Dressing applied.  The patient transferred to the recovery room  in satisfactory condition, having tolerated the procedure well.                                               Rose Phi. Maple Hudson, M.D.    PRY/MEDQ  D:  04/19/2003  T:  04/20/2003  Job:  [id number]

## 2011-04-27 NOTE — Op Note (Signed)
NAME:  Adriana Hill, Adriana Hill                           ACCOUNT NO.:  [id number]   MEDICAL RECORD NO.:  [id number]                   PATIENT TYPE:  AMB   LOCATION:  DSC                                  FACILITY:  [facility]   PHYSICIAN:  Rose Phi. Maple Hudson, M.D.                DATE OF BIRTH:  12/05/1946   DATE OF PROCEDURE:  03/22/2003  DATE OF DISCHARGE:                                 OPERATIVE REPORT   PREOPERATIVE DIAGNOSIS:  Stage II carcinoma of the right breast.   POSTOPERATIVE DIAGNOSIS:  Stage II carcinoma of the right breast.   OPERATION PERFORMED:  Right modified radical mastectomy.   SURGEON:  Rose Phi. Maple Hudson, M.D.   ASSISTANT:  Lebron Conners, M.D.   ANESTHESIA:  General.   INDICATIONS FOR PROCEDURE:  This 65 year old female had presented with a  palpable mass in her right breast.  A week ago we had done a right partial  mastectomy and sentinel node biopsy.  The sentinel node had a  micrometastasis in it.  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   DESCRIPTION OF PROCEDURE:  After suitable general anesthesia was induced,  the patient was placed in supine position with the right arm extended on the  arm board.  The right breast and axilla were then prepped and draped in the  usual fashion.  A transverse elliptical incision incorporating the previous  lumpectomy site and the nipple areolar complex was then outlined.  Incision  was made and then we dissected a superior flap to the clavicle and then  medially to the sternum  and inferiorly to the rectus fascia and laterally  to the latissimus dorsi muscle.  We removed the breast by dissecting from  medial to lateral until we got to the margin of the pectoralis major muscle.  We then dissected along the major muscle and then exposed the pectoralis  minor and dissected along it with preservation of  the medial pectoral nerve.  The axillary vein was then exposed and then we swept all the tissue out  inferior to the vein and from beneath the pectoralis minor muscle giving a  level one and level two node dissection.  The long thoracic and  thoracodorsal nerves were identified and preserved.  Other vessels and  nerves were clipped and divided.  Following removal of the specimen, we had  good hemostasis.  We thoroughly irrigated the field with  saline.  Two 19 French Blake drains were then inserted, one into the axilla  and one up over the chest wall.  The skin was then stapled and dressings  applied.  The patient was then transferred to the recovery room in  satisfactory condition having tolerated the procedure well.  Rose Phi. Maple Hudson, M.D.    PRY/MEDQ  D:  03/22/2003  T:  03/22/2003  Job:  [id number]   cc:   Malva Limes, M.D.  [address]  [place]  [place] [address]  Fax: [phone]   Al Decant. Janey Greaser, M.D.  [address]  [place]  [place] [address]  Fax: [phone]

## 2011-04-27 NOTE — Discharge Summary (Signed)
NAME:  Adriana Hill, Adriana Hill                           ACCOUNT NO.:  [id number]   MEDICAL RECORD NO.:  [id number]                   PATIENT TYPE:  INP   LOCATION:  0455                                 FACILITY:  [facility]   PHYSICIAN:  Valentino Hue. Magrinat, M.D.            DATE OF BIRTH:  05/10/1946   DATE OF ADMISSION:  06/18/2003  DATE OF DISCHARGE:  06/22/2003                                 DISCHARGE SUMMARY   DISCHARGE DIAGNOSES:  1. Pneumonitis, possibly reactive, possibly viral.  2. Fever of unknown origin, likely secondary to above.  3. Breast cancer, receiving chemotherapy.  4. Remote history of cervical dysplasia.  5. Possible anxiety reaction.  6. Anemia.   PROCEDURES:  1. CT scan of the neck.  2. CT of the chest.  3. CT of the abdomen.  4. CT of the pelvis.  5. Intravenous antibiotics.   HOSPITAL COURSE:  The patient was admitted for evaluation of fever which had  no focal symptoms and no focal findings by exam other then some discomfort  in the right jaw and right ear area, and a dry cough.  Blood cultures  obtained before the start of antibiotics on June 18, 2003, remained negative,  as do urine cultures and chest x-ray showed no evidence of pneumonia.  The  patient was repeatedly cultured since.  She received one dose of vancomycin  which she did not tolerate well, and she has been on Primaxin for three days  without defervescing.  At that point, her antibiotics were discontinued, but  her fever persisted.  CT scan of the chest, abdomen, and pelvis, as well as  the neck and sinuses were obtained on June 21, 2003.  There is no evidence  of sinusitis, there is no evidence of any neck or throat problem, and  basically the CT's are entirely negative except for a peripheral  interstitial pneumonitis which is very fine and not seen centrally at all.  There is no alveolitis and there is no fluid.  This does not look at all  like a bacterial or fungal infection.  The chief  differential is a reactive  pneumonitis possibly secondary to chemotherapy or some inhalatory material  (which would have to be exceedingly fine to reach the periphery of the lungs  and spare the center), or a viral problem.   After much discussion with the patient, we have decided that she will be  discharged home as she is not neutropenic, and has been stable except for  the fever.  She will be on a Medrol Dosepak.  I have asked Dr. Ninetta Lights of  infectious diseases to consult, and he will be seeing the patient prior to  discharge today.  The patient will have close followup with me, and she will  see me again on June 30, 2003.  Of course, she will call for any problems  that may develop before them.  We are going to discontinue the Cytoxan and  adriamycin of which she has received three cycles in a dose-fashion, and we  will start on weekly Taxol x12, July 09, 2003.   CONDITION ON DISCHARGE:  Fever persists.   DISCHARGE MEDICATIONS:  1. Acyclovir 400 mg b.i.d.  2. Protonix 40 mg daily.  3. Remeron 15 mg daily.  4. Medrol Dosepak to take as directed.  5. Aleve one or two tablets with meals and at bedtime as needed for fever.  6. Phenergan with codeine syrup 2 teaspoons p.r.n. cough b.i.d. to t.i.d.   PAIN MANAGEMENT:  In addition to above, will be Magic Mouthwash p.r.n.   ACTIVITY:  Unrestricted.   DIET:  Unrestricted.   WOUND CARE:  Not applicable.   SPECIAL INSTRUCTIONS:  She will call for pain, bleeding, rash, shortness of  breath, or any other problem.   FOLLOWUP:  She will see me on June 30, 2003, and we will start weekly Taxol  on July 09, 2003.  She will call the office for specific times.                                               Valentino Hue. Magrinat, M.D.    Ronna Polio  D:  06/22/2003  T:  06/22/2003  Job:  [id number]   cc:   Lacretia Leigh. Ninetta Lights, M.D.  [address]  [place]  [place] [address]  Fax: [phone]   Rose Phi. Young, M.D.  [address]  [place]  [place] [address]  Fax: [phone]   Al Decant. Janey Greaser, M.D.  [address]  [place]  [place] [address]  Fax: [phone]   Malva Limes, M.D.  [address]  [place]  [place] [address]  Fax: [phone]   Llana Aliment. Malon Kindle., M.D.  [address]  [place]  [place] [address]  Fax: [phone]   Sheppard Plumber. Earlene Plater, M.D.  [address] [place]  [place] [address]  Fax: [phone]

## 2011-04-27 NOTE — H&P (Signed)
NAME:  Adriana Hill, Adriana Hill                           ACCOUNT NO.:  [id number]   MEDICAL RECORD NO.:  [id number]                   PATIENT TYPE:  INP   LOCATION:  0364                                 FACILITY:  [facility]   PHYSICIAN:  Velora Heckler, M.D.                DATE OF BIRTH:  09-04-1946   DATE OF ADMISSION:  04/23/2003  DATE OF DISCHARGE:                                HISTORY & PHYSICAL   REFERRING PHYSICIANS:  1. Rose Phi. Maple Hudson, M.D.  2. Valentino Hue. Magrinat, M.D.   REASON FOR ADMISSION:  Left complete pneumothorax.   HISTORY OF PRESENT ILLNESS:  The patient is a 65 year old white female with  right breast carcinoma.  The patient had undergone an attempted infusion  port placement in the operating room on Apr 19, 2003.  This was  unsuccessful.  Chest x-ray following the procedure was normal, and the  patient was discharged home.  The patient then experienced onset of left-  sided chest pain and shortness of breath.  This has been progressive over  the past 3-4 days, although has improved over the past 24 hours.  The  patient was seen in follow-up today by Valentino Hue. Magrinat, M.D., and a PA  and lateral chest x-ray was obtained due to her symptoms.  X-ray is reviewed  and shows a complete collapse of the left lung with effusion on the left  side.  The patient is now sent to the emergency department for evaluation  and management.   PAST MEDICAL HISTORY:  1. Status post right mastectomy, March 2004, for carcinoma of the breast.  2. History of childbirth x 2.   MEDICATIONS:  None.   ALLERGIES:  THEO-DUR, FLAGYL, ERYTHROMYCIN, VICODIN causing hives.   SOCIAL HISTORY:  The patient is accompanied by her husband.  She lives in  [place].  She does not smoke.  She drinks alcohol on social occasions  only.  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   REVIEW OF SYSTEMS:  15 system reviewed, discussed with the patient without  significant other problems except as noted  above.   FAMILY HISTORY:  Noncontributory.   PHYSICAL EXAMINATION:  GENERAL:  A 65 year old, thin, bright, alert, white  female, on a stretcher in the emergency department.  VITAL SIGNS:  Temp. 98.7, pulse 91, respirations 20, blood pressure 143/75,  O2 saturation 100% on 4 liter nasal cannula.  HEENT:  Normocephalic; sclerae are clear; conjunctivae are clear; dentition  is good; voice is normal.  NECK:  Anterior examination of the neck shows it to be symmetric.  There is  an ecchymosis at the left sternocleidomastoid muscle.  Palpation of the  thyroid shows no nodularity.  LUNGS:  Good breath sounds on the right, but breath sounds are largely  absent in the left thorax, both anteriorly and posteriorly.  There is no  crepitus.  CARDIAC:  Regular rate and  rhythm, although heart sounds are muffled.  Peripheral pulses are full and regular.  ABDOMEN:  Soft without distention.  There is no tenderness.  There are bowel  sounds present.  EXTREMITIES:  Nontender without edema.  NEUROLOGIC:  The patient is alert and oriented without focal deficit.   LABORATORY STUDIES:  Pending at the time of dictation.   RADIOGRAPHIC STUDIES:  Chest x-ray dated Apr 23, 2003, showing complete  collapse of the left lung with obvious air fluid level in the sulcus  laterally.   IMPRESSION:  1. Complete left pneumothorax.  2. Left hemothorax.  3. History of breast cancer.   PLAN:  1. Admission to [facility].  2. Placement of left thoracostomy tube.  3. Follow-up chest x-rays.  4. Notification of oncology service of admission.  5. Delay of scheduled port placement.                                               Velora Heckler, M.D.    TMG/MEDQ  D:  04/23/2003  T:  04/23/2003  Job:  [id number]   cc:   Rose Phi. Young, M.D.  [address]  [place]  [place] [address]  Fax: [phone]   Valentino Hue. Magrinat, M.D.  [address] [facility]  [place]  [place] [address]  Fax:  [phone]

## 2011-09-14 ENCOUNTER — Ambulatory Visit
Admission: RE | Admit: 2011-09-14 | Discharge: 2011-09-14 | Disposition: A | Discharge status: Home / Self Care | Payer: 59 | Source: Ambulatory Visit | Attending: Oncology | Admitting: Oncology

## 2011-09-14 ENCOUNTER — Other Ambulatory Visit: Payer: Self-pay | Admitting: Oncology

## 2011-09-14 DIAGNOSIS — Z1231 Encounter for screening mammogram for malignant neoplasm of breast: Secondary | ICD-10-CM

## 2011-09-14 DIAGNOSIS — Z853 Personal history of malignant neoplasm of breast: Secondary | ICD-10-CM

## 2011-11-29 ENCOUNTER — Ambulatory Visit: Payer: 59

## 2011-12-20 ENCOUNTER — Ambulatory Visit
Admission: RE | Admit: 2011-12-20 | Discharge: 2011-12-20 | Disposition: A | Discharge status: Home / Self Care | Payer: 59 | Source: Ambulatory Visit | Attending: Oncology | Admitting: Oncology

## 2011-12-20 DIAGNOSIS — Z1231 Encounter for screening mammogram for malignant neoplasm of breast: Secondary | ICD-10-CM

## 2012-02-01 ENCOUNTER — Telehealth: Payer: Self-pay | Admitting: Oncology

## 2012-02-01 NOTE — Telephone Encounter (Signed)
lmonvm adviisng the pt of her April and may 2013 appts

## 2012-04-08 ENCOUNTER — Other Ambulatory Visit (HOSPITAL_BASED_OUTPATIENT_CLINIC_OR_DEPARTMENT_OTHER): Payer: 59 | Admitting: Lab

## 2012-04-08 DIAGNOSIS — C50419 Malignant neoplasm of upper-outer quadrant of unspecified female breast: Secondary | ICD-10-CM

## 2012-04-08 LAB — CBC WITH DIFFERENTIAL/PLATELET
BASO%: 0.4 % (ref 0.0–2.0)
EOS%: 3.2 % (ref 0.0–7.0)
LYMPH%: 35.6 % (ref 14.0–49.7)
MCH: 30.2 pg (ref 25.1–34.0)
MCHC: 32.2 g/dL (ref 31.5–36.0)
MCV: 93.9 fL (ref 79.5–101.0)
MONO#: 0.6 10*3/uL (ref 0.1–0.9)
MONO%: 9.4 % (ref 0.0–14.0)
Platelets: 260 10*3/uL (ref 145–400)
RBC: 3.91 10*6/uL (ref 3.70–5.45)
WBC: 6.8 10*3/uL (ref 3.9–10.3)

## 2012-04-09 LAB — COMPREHENSIVE METABOLIC PANEL
ALT: 13 U/L (ref 0–35)
AST: 21 U/L (ref 0–37)
Alkaline Phosphatase: 63 U/L (ref 39–117)
Creatinine, Ser: 0.9 mg/dL (ref 0.50–1.10)
Sodium: 139 mEq/L (ref 135–145)
Total Bilirubin: 0.4 mg/dL (ref 0.3–1.2)
Total Protein: 7 g/dL (ref 6.0–8.3)

## 2012-04-15 ENCOUNTER — Telehealth: Payer: Self-pay | Admitting: *Deleted

## 2012-04-15 ENCOUNTER — Ambulatory Visit (HOSPITAL_BASED_OUTPATIENT_CLINIC_OR_DEPARTMENT_OTHER): Payer: 59 | Admitting: Oncology

## 2012-04-15 VITALS — BP 122/79 | HR 97 | Temp 98.8°F | Ht 62.5 in | Wt 121.8 lb

## 2012-04-15 DIAGNOSIS — C50419 Malignant neoplasm of upper-outer quadrant of unspecified female breast: Secondary | ICD-10-CM

## 2012-04-15 DIAGNOSIS — C50919 Malignant neoplasm of unspecified site of unspecified female breast: Secondary | ICD-10-CM | POA: Insufficient documentation

## 2012-04-15 DIAGNOSIS — M810 Age-related osteoporosis without current pathological fracture: Secondary | ICD-10-CM

## 2012-04-15 NOTE — Progress Notes (Signed)
ID: Adriana Hill  DOB: 03/05/1946  MR#: [id number]  CSN#: [id number]   Interval History:  Adriana Hill returns today for followup of her breast cancer. The interval history is generally unremarkable. She has the same job. Her 2  grandchildren are now 3 and 1. The [place] her daughter works for [employer] and her son-in-law for the [employer]  ROS:  She still has occasional fleeting pains in the surgical breast. She bruises easily. She has a few hot flashes which are not a major issue. She keeps her sinus problems particularly this time of year. She thinks she is getting more facial hair and that indeed could be related to her  Letrozole. Otherwise a detailed review of systems is noncontributory  No Known Allergies  Current Outpatient Prescriptions  Medication Sig Dispense Refill  . letrozole (FEMARA) 2.5 MG tablet          Objective: Middle-aged white woman who appears fit  Filed Vitals:   04/15/12 1054  BP: 122/79  Pulse: 97  Temp: 98.8 F (37.1 C)    BMI: Body mass index is 21.92 kg/(m^2).   ECOG FS: 0  Physical Exam:   Sclerae unicteric  Oropharynx clear  No peripheral adenopathy  Lungs clear -- no rales or rhonchi  Heart regular rate and rhythm  Abdomen benign  MSK no focal spinal tenderness, no peripheral edema  Neuro nonfocal  Breast exam right breast status post mastectomy; there are significant on dictation is from the radiation, but no evidence of local recurrence. Left breast is unremarkable  Lab Results:      Chemistry      Component Value Date/Time   NA 139 04/08/2012 1407   K 4.0 04/08/2012 1407   CL 103 04/08/2012 1407   CO2 30 04/08/2012 1407   BUN 20 04/08/2012 1407   CREATININE 0.90 04/08/2012 1407      Component Value Date/Time   CALCIUM 9.3 04/08/2012 1407   ALKPHOS 63 04/08/2012 1407   AST 21 04/08/2012 1407   ALT 13 04/08/2012 1407   BILITOT 0.4 04/08/2012 1407       Lab Results  Component Value Date   WBC  6.8 04/08/2012   HGB 11.8 04/08/2012   HCT 36.7 04/08/2012   MCV 93.9 04/08/2012   PLT 260 04/08/2012   NEUTROABS 3.5 04/08/2012    Studies/Results:  Mammography January of this year was unremarkable. Bone density October of 2012 shows osteoporosis, slightly progressive as compared to 2009.  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  She was then on tamoxifen from January 2005 to November 2009, at which point she was switched to letrozole.      Plan: We discussed the osteoporosis issue at length. I think she would benefit from zoledronic acid. She has many concerns regarding this and will continue to evaluated. In particular she would not want to receive "full dose". If she wishes we can cut the usual 5 mg dose of week last to 2.5. I think that would be better than nothing. We also discussed denosumab as an alternative.  I offered to switch off back to tamoxifen, but she is concerned regarding blood clots with that, so the plan is going to be to continue the letrozole one more year. She will see me again  May of next year. She knows to call for any problems that may develop before then.     Adriana Hill C 04/15/2012

## 2012-04-15 NOTE — Telephone Encounter (Signed)
gave patient appointment for 2014 printed out calendar and gave to the patient 

## 2012-05-07 ENCOUNTER — Other Ambulatory Visit (INDEPENDENT_AMBULATORY_CARE_PROVIDER_SITE_OTHER): Payer: 59

## 2012-05-07 DIAGNOSIS — Z0000 Encounter for general adult medical examination without abnormal findings: Secondary | ICD-10-CM

## 2012-05-07 LAB — CBC WITH DIFFERENTIAL/PLATELET
Basophils Relative: 0.8 % (ref 0.0–3.0)
Eosinophils Absolute: 0.2 10*3/uL (ref 0.0–0.7)
Eosinophils Relative: 3.3 % (ref 0.0–5.0)
Hemoglobin: 12.5 g/dL (ref 12.0–15.0)
Lymphocytes Relative: 28.8 % (ref 12.0–46.0)
MCHC: 32.5 g/dL (ref 30.0–36.0)
MCV: 95.1 fl (ref 78.0–100.0)
Neutro Abs: 3.8 10*3/uL (ref 1.4–7.7)
Neutrophils Relative %: 57.3 % (ref 43.0–77.0)
RBC: 4.04 Mil/uL (ref 3.87–5.11)
WBC: 6.6 10*3/uL (ref 4.5–10.5)

## 2012-05-07 LAB — LIPID PANEL
HDL: 53.8 mg/dL (ref >39.00)
LDL Cholesterol: 121 mg/dL — ABNORMAL HIGH (ref 0–99)
VLDL: 18.2 mg/dL (ref 0.0–40.0)

## 2012-05-07 LAB — HEPATIC FUNCTION PANEL
ALT: 17 U/L (ref 0–35)
Bilirubin, Direct: 0 mg/dL (ref 0.0–0.3)
Total Bilirubin: 0.5 mg/dL (ref 0.3–1.2)
Total Protein: 7.1 g/dL (ref 6.0–8.3)

## 2012-05-07 LAB — POCT URINALYSIS DIPSTICK
Bilirubin, UA: NEGATIVE
Glucose, UA: NEGATIVE
Ketones, UA: NEGATIVE
Spec Grav, UA: 1.02

## 2012-05-07 LAB — BASIC METABOLIC PANEL
BUN: 17 mg/dL (ref 6–23)
CO2: 29 mEq/L (ref 19–32)
Chloride: 104 mEq/L (ref 96–112)
Creatinine, Ser: 0.8 mg/dL (ref 0.4–1.2)
Potassium: 4.3 mEq/L (ref 3.5–5.1)

## 2012-05-12 ENCOUNTER — Ambulatory Visit (INDEPENDENT_AMBULATORY_CARE_PROVIDER_SITE_OTHER): Payer: 59 | Admitting: Family Medicine

## 2012-05-12 ENCOUNTER — Encounter: Payer: Self-pay | Admitting: Family Medicine

## 2012-05-12 VITALS — BP 100/62 | HR 80 | Temp 98.5°F | Resp 12 | Ht 62.0 in | Wt 122.0 lb

## 2012-05-12 DIAGNOSIS — R3129 Other microscopic hematuria: Secondary | ICD-10-CM

## 2012-05-12 DIAGNOSIS — Z0000 Encounter for general adult medical examination without abnormal findings: Secondary | ICD-10-CM

## 2012-05-12 LAB — POCT URINALYSIS DIPSTICK
Bilirubin, UA: NEGATIVE
Glucose, UA: NEGATIVE
Ketones, UA: NEGATIVE
Spec Grav, UA: 1.025

## 2012-05-12 NOTE — Progress Notes (Addendum)
  Subjective:    Patient ID: Adriana Hill, female    DOB: 07-04-1946, 66 y.o.   MRN: [id number]  HPI  Complete physical. Patient sees gynecologist for Pap smears and breast exams. She has history of breast cancer with previous mastectomy, chemotherapy, and radiation. She is followed closely by oncology. She has history of osteoporosis and is considering injectable therapy. Colonoscopy 2009. Last tetanus 2005. No indications for Pneumovax until next year. No history of shingles vaccine. She remains on Femara for one more year.  Patient is adopted so family history unknown. No history of smoking.  Past Medical History  Diagnosis Date  . Blood in stool   . Cancer 2004    breast  . Allergy     hay fever   Past Surgical History  Procedure Date  . Mastectomy 2004    right    reports that she has never smoked. She does not have any smokeless tobacco history on file. Her alcohol and drug histories not on file. family history is not on file. No Known Allergies    Review of Systems  Constitutional: Negative for fever, activity change, appetite change, fatigue and unexpected weight change.  HENT: Negative for hearing loss, ear pain, sore throat and trouble swallowing.   Eyes: Negative for visual disturbance.  Respiratory: Negative for cough and shortness of breath.   Cardiovascular: Negative for chest pain and palpitations.  Gastrointestinal: Negative for abdominal pain, diarrhea, constipation and blood in stool.  Genitourinary: Negative for dysuria and hematuria.  Musculoskeletal: Positive for arthralgias. Negative for myalgias and back pain.  Skin: Negative for rash.  Neurological: Negative for dizziness, syncope and headaches.  Hematological: Negative for adenopathy.  Psychiatric/Behavioral: Negative for confusion and dysphoric mood.       Objective:   Physical Exam  Constitutional: She is oriented to person, place, and time. She appears well-developed and well-nourished.    HENT:  Right Ear: External ear normal.  Left Ear: External ear normal.  Mouth/Throat: Oropharynx is clear and moist.  Neck: Neck supple. No thyromegaly present.  Cardiovascular: Normal rate and regular rhythm.   Pulmonary/Chest: Effort normal and breath sounds normal. No respiratory distress. She has no wheezes. She has no rales.  Abdominal: Soft. She exhibits no distension and no mass. There is no tenderness. There is no rebound and no guarding.  Genitourinary:       Per GYN  Musculoskeletal: She exhibits no edema.  Lymphadenopathy:    She has no cervical adenopathy.  Neurological: She is alert and oriented to person, place, and time. No cranial nerve deficit.  Skin: No rash noted.  Psychiatric: She has a normal mood and affect. Her behavior is normal.          Assessment & Plan:  Complete physical. Patient will check on coverage for shingles vaccine. Labs reviewed with patient. Trace blood on urine dipstick. Repeat urine and if still persistent then urine micro-and urology refer if >3RBC/HPF. Continue regular GYN followup. Will need pneumonia vaccine by next year. Continue yearly flu vaccine.  Repeat urine with micro-3-6 red blood cells per high-power field. Set up urology referral

## 2012-05-13 ENCOUNTER — Other Ambulatory Visit: Payer: Self-pay | Admitting: Oncology

## 2012-05-13 DIAGNOSIS — C50919 Malignant neoplasm of unspecified site of unspecified female breast: Secondary | ICD-10-CM

## 2012-05-13 LAB — URINALYSIS, MICROSCOPIC ONLY
Casts: NONE SEEN
Crystals: NONE SEEN
Squamous Epithelial / LPF: NONE SEEN

## 2012-05-15 ENCOUNTER — Telehealth: Payer: Self-pay | Admitting: Family Medicine

## 2012-05-15 NOTE — Telephone Encounter (Signed)
I do not see recent blood work, she did have a repeat UA?

## 2012-05-15 NOTE — Progress Notes (Signed)
Addended by: Kristian Covey on: 05/15/2012 05:55 PM   Modules accepted: Orders

## 2012-05-15 NOTE — Telephone Encounter (Signed)
Pt needs blood work results °

## 2012-05-16 NOTE — Telephone Encounter (Signed)
Pt has been notified and urology appt made

## 2012-12-17 ENCOUNTER — Other Ambulatory Visit: Payer: Self-pay | Admitting: Oncology

## 2012-12-17 DIAGNOSIS — Z9011 Acquired absence of right breast and nipple: Secondary | ICD-10-CM

## 2012-12-17 DIAGNOSIS — Z1231 Encounter for screening mammogram for malignant neoplasm of breast: Secondary | ICD-10-CM

## 2013-01-12 ENCOUNTER — Ambulatory Visit
Admission: RE | Admit: 2013-01-12 | Discharge: 2013-01-12 | Disposition: A | Discharge status: Home / Self Care | Payer: 59 | Source: Ambulatory Visit | Attending: Oncology | Admitting: Oncology

## 2013-01-12 DIAGNOSIS — Z1231 Encounter for screening mammogram for malignant neoplasm of breast: Secondary | ICD-10-CM

## 2013-01-12 DIAGNOSIS — Z9011 Acquired absence of right breast and nipple: Secondary | ICD-10-CM

## 2013-02-09 ENCOUNTER — Emergency Department (HOSPITAL_COMMUNITY)
Admission: EM | Admit: 2013-02-09 | Discharge: 2013-02-09 | Disposition: A | Discharge status: Home / Self Care | Payer: 59 | Attending: Emergency Medicine | Admitting: Emergency Medicine

## 2013-02-09 ENCOUNTER — Encounter (HOSPITAL_COMMUNITY): Payer: Self-pay | Admitting: Emergency Medicine

## 2013-02-09 DIAGNOSIS — E860 Dehydration: Secondary | ICD-10-CM

## 2013-02-09 DIAGNOSIS — Z8719 Personal history of other diseases of the digestive system: Secondary | ICD-10-CM | POA: Insufficient documentation

## 2013-02-09 DIAGNOSIS — R197 Diarrhea, unspecified: Secondary | ICD-10-CM

## 2013-02-09 DIAGNOSIS — Z8709 Personal history of other diseases of the respiratory system: Secondary | ICD-10-CM | POA: Insufficient documentation

## 2013-02-09 DIAGNOSIS — R000 Tachycardia, unspecified: Secondary | ICD-10-CM | POA: Insufficient documentation

## 2013-02-09 DIAGNOSIS — R109 Unspecified abdominal pain: Secondary | ICD-10-CM | POA: Insufficient documentation

## 2013-02-09 DIAGNOSIS — Z853 Personal history of malignant neoplasm of breast: Secondary | ICD-10-CM | POA: Insufficient documentation

## 2013-02-09 LAB — CBC WITH DIFFERENTIAL/PLATELET
Basophils Relative: 1 % (ref 0–1)
Eosinophils Absolute: 0.1 10*3/uL (ref 0.0–0.7)
Eosinophils Relative: 2 % (ref 0–5)
Lymphs Abs: 1.8 10*3/uL (ref 0.7–4.0)
MCH: 30.2 pg (ref 26.0–34.0)
MCHC: 31.9 g/dL (ref 30.0–36.0)
MCV: 94.8 fL (ref 78.0–100.0)
Platelets: 250 10*3/uL (ref 150–400)
RBC: 3.84 MIL/uL — ABNORMAL LOW (ref 3.87–5.11)

## 2013-02-09 LAB — COMPREHENSIVE METABOLIC PANEL
ALT: 15 U/L (ref 0–35)
Albumin: 3.2 g/dL — ABNORMAL LOW (ref 3.5–5.2)
BUN: 11 mg/dL (ref 6–23)
Calcium: 7.7 mg/dL — ABNORMAL LOW (ref 8.4–10.5)
GFR calc Af Amer: 88 mL/min — ABNORMAL LOW (ref >90)
Glucose, Bld: 89 mg/dL (ref 70–99)
Sodium: 140 mEq/L (ref 135–145)
Total Protein: 6.5 g/dL (ref 6.0–8.3)

## 2013-02-09 MED ORDER — LOPERAMIDE HCL 2 MG PO CAPS: 2.0000 mg | ORAL_CAPSULE | Freq: Four times a day (QID) | ORAL | Status: DC | PRN

## 2013-02-09 MED ORDER — SODIUM CHLORIDE 0.9 % IV BOLUS (SEPSIS)
1000.0000 mL | INTRAVENOUS | Status: AC
  Administered 2013-02-09: 1000 mL via INTRAVENOUS

## 2013-02-09 MED ORDER — SODIUM CHLORIDE 0.9 % IV SOLN: Freq: Once | INTRAVENOUS | Status: DC

## 2013-02-09 NOTE — ED Provider Notes (Signed)
History     CSN: [id number]  Arrival date & time 02/09/13  0818   First MD Initiated Contact with Patient 02/09/13 [phone]      Chief Complaint  Patient presents with  . Diarrhea  . Dizziness    (Consider location/radiation/quality/duration/timing/severity/associated sxs/prior treatment) Patient is a 67 y.o. female presenting with diarrhea. The history is provided by the patient. No language interpreter was used.  Diarrhea Quality:  Mucous Severity:  Moderate Number of episodes:  TNTC Duration:  4 days Timing:  Intermittent Progression:  Improving (symptoms improved and then returned.) Relieved by:  Anti-motility medications Worsened by:  Nothing tried Associated symptoms: chills   Associated symptoms: no abdominal pain, no recent cough, no diaphoresis, no fever, no headaches, no URI and no vomiting   Risk factors: no recent antibiotic use, no sick contacts, no suspicious food intake and no travel to endemic areas     Past Medical History  Diagnosis Date  . Blood in stool   . Cancer 2004    breast  . Allergy     hay fever    Past Surgical History  Procedure Laterality Date  . Mastectomy  2004    right    No family history on file.  History  Substance Use Topics  . Smoking status: Never Smoker   . Smokeless tobacco: Not on file  . Alcohol Use: Not on file    OB History   Grav Para Term Preterm Abortions TAB SAB Ect Mult Living                  Review of Systems  Constitutional: Positive for chills and appetite change. Negative for fever, diaphoresis, activity change and fatigue.  HENT: Negative for congestion and rhinorrhea.   Eyes: Negative for visual disturbance.  Respiratory: Negative for cough, chest tightness and shortness of breath.   Cardiovascular: Negative for chest pain and palpitations.  Gastrointestinal: Positive for nausea, diarrhea and abdominal distention ("bloating"). Negative for vomiting, abdominal pain, constipation, blood in stool and  anal bleeding.  Genitourinary: Negative for flank pain.  Musculoskeletal: Negative.   Skin: Negative.   Neurological: Negative.  Negative for headaches.  Psychiatric/Behavioral: Negative.     Allergies  Theophyllines  Home Medications  No current outpatient prescriptions on file.  BP 114/74  Pulse 108  Temp(Src) 98.7 F (37.1 C) (Oral)  Resp 16  SpO2 98%  Physical Exam  Nursing note and vitals reviewed. Constitutional: She is oriented to person, place, and time. She appears well-developed and well-nourished. No distress.  HENT:  Head: Normocephalic and atraumatic.  Right Ear: External ear normal.  Left Ear: External ear normal.  Nose: Nose normal.  Mouth/Throat: No oropharyngeal exudate.  Eyes: Conjunctivae are normal. Pupils are equal, round, and reactive to light. Right eye exhibits no discharge. Left eye exhibits no discharge. No scleral icterus.  Neck: Normal range of motion. Neck supple. No JVD present. No tracheal deviation present.  Cardiovascular: S1 normal, S2 normal, normal heart sounds, intact distal pulses and normal pulses.   No extrasystoles are present. Tachycardia present.  PMI is not displaced.  Exam reveals no gallop, no friction rub and no decreased pulses.   No murmur heard. Pulmonary/Chest: Effort normal and breath sounds normal. No stridor. No respiratory distress. She has no wheezes. She has no rales. She exhibits no tenderness.  Abdominal: Soft. Bowel sounds are normal. She exhibits no distension and no mass. There is no tenderness. There is no rebound and no guarding.  Musculoskeletal:  Normal range of motion. She exhibits no edema and no tenderness.  Lymphadenopathy:    She has no cervical adenopathy.  Neurological: She is alert and oriented to person, place, and time. No cranial nerve deficit.  Skin: Skin is warm and dry. No rash noted. She is not diaphoretic. No erythema. No pallor.  Psychiatric: She has a normal mood and affect. Her behavior is  normal.    ED Course  Procedures (including critical care time)  Labs Reviewed  CBC WITH DIFFERENTIAL  COMPREHENSIVE METABOLIC PANEL   No results found.   No diagnosis found.    MDM  Pt presents for evaluation of diarrhea x 4 days.  She has mild tachycardia, note stable BP, NAD.  Pt has no reproducible abdominal pain.  Will obtain orthostatic VS and bolus IVF.  Will also obtain basic labs and reassess.  1430.  Pt stable, NAD.  Tachycardia improved.  Pt appears nontoxic.  Plan symptomatic care and close outpt f/u.     Tobin Chad, MD 02/09/13 1432

## 2013-02-09 NOTE — ED Notes (Signed)
Adriana Hill, Lavender labs were collected as a recollect and it was sent down to the lab. Nurse was notified.

## 2013-02-09 NOTE — ED Notes (Signed)
Pt arrived by EMS complaining of diarrhea and dizziness. Pt's symptoms started four days ago.

## 2013-04-09 ENCOUNTER — Other Ambulatory Visit (HOSPITAL_BASED_OUTPATIENT_CLINIC_OR_DEPARTMENT_OTHER): Payer: 59 | Admitting: Lab

## 2013-04-09 DIAGNOSIS — C50419 Malignant neoplasm of upper-outer quadrant of unspecified female breast: Secondary | ICD-10-CM

## 2013-04-09 DIAGNOSIS — C50919 Malignant neoplasm of unspecified site of unspecified female breast: Secondary | ICD-10-CM

## 2013-04-09 LAB — COMPREHENSIVE METABOLIC PANEL (CC13)
AST: 22 U/L (ref 5–34)
Albumin: 3.4 g/dL — ABNORMAL LOW (ref 3.5–5.0)
BUN: 18.1 mg/dL (ref 7.0–26.0)
CO2: 28 mEq/L (ref 22–29)
Calcium: 8.8 mg/dL (ref 8.4–10.4)
Chloride: 102 mEq/L (ref 98–107)
Glucose: 88 mg/dl (ref 70–99)
Potassium: 4 mEq/L (ref 3.5–5.1)

## 2013-04-09 LAB — CBC WITH DIFFERENTIAL/PLATELET
Basophils Absolute: 0.1 10*3/uL (ref 0.0–0.1)
HCT: 36.3 % (ref 34.8–46.6)
HGB: 11.8 g/dL (ref 11.6–15.9)
MONO#: 0.9 10*3/uL (ref 0.1–0.9)
NEUT%: 48.9 % (ref 38.4–76.8)
Platelets: 266 10*3/uL (ref 145–400)
WBC: 7.2 10*3/uL (ref 3.9–10.3)
lymph#: 2.5 10*3/uL (ref 0.9–3.3)

## 2013-04-16 ENCOUNTER — Ambulatory Visit (HOSPITAL_BASED_OUTPATIENT_CLINIC_OR_DEPARTMENT_OTHER): Payer: 59 | Admitting: Oncology

## 2013-04-16 ENCOUNTER — Telehealth: Payer: Self-pay | Admitting: *Deleted

## 2013-04-16 VITALS — BP 139/86 | HR 85 | Temp 98.9°F | Resp 20 | Ht 62.0 in | Wt 122.1 lb

## 2013-04-16 DIAGNOSIS — C50911 Malignant neoplasm of unspecified site of right female breast: Secondary | ICD-10-CM

## 2013-04-16 DIAGNOSIS — C50419 Malignant neoplasm of upper-outer quadrant of unspecified female breast: Secondary | ICD-10-CM

## 2013-04-16 NOTE — Telephone Encounter (Signed)
appts made and printed...td 

## 2013-04-16 NOTE — Progress Notes (Signed)
ID: Lucia Gaskins  DOB: November 14, 1946  MR#: [id number]  CSN#: [id number]  PCP: Kristian Covey, MD GYN: SU: OTHER MD: Chana Bode Nesi  Interval History:  Makaylin returns today for followup of her breast cancer. The interval history is generally unremarkable. She continues to work at [employer], likes her job, and is not planning on retirement.. Her 2  grandchildren are now 4 and 2. The live in [facility] where her daughter works for [employer] and her son-in-law for the [employer]  ROS:  Since her last visit here she was found to have hematuria and Dr. Caryl Never referred her to Dr. knee C., where extensive workup did not reveal any malignancy or other cause. She still has microscopic hematuria, and this is being file. Otherwise a detailed review of systems is significant for mild seasonal allergies only  Allergies  Allergen Reactions  . Theophyllines Rash    Current Outpatient Prescriptions  Medication Sig Dispense Refill  . loperamide (IMODIUM) 2 MG capsule Take 1 capsule (2 mg total) by mouth 4 (four) times daily as needed for diarrhea or loose stools.  12 capsule  0   No current facility-administered medications for this visit.     Objective: Middle-aged white woman in no acute distress  Filed Vitals:   04/16/13 1052  BP: 139/86  Pulse: 85  Temp: 98.9 F (37.2 C)  Resp: 20    BMI: Body mass index is 22.33 kg/(m^2).   ECOG FS: 0  Physical Exam:   Sclerae unicteric  Oropharynx clear  No peripheral adenopathy  Lungs clear -- no rales or rhonchi  Heart regular rate and rhythm  Abdomen benign  MSK no focal spinal tenderness, no peripheral edema  Neuro nonfocal, well oriented, pleasant affect  Breast exam right breast status post mastectomy; there is no evidence of local recurrence. The right axilla is benign. Left breast is unremarkable  Lab Results:      Chemistry      Component Value Date/Time   NA 137 04/09/2013 1552   NA 140 02/09/2013 1000    K 4.0 04/09/2013 1552   K 3.6 02/09/2013 1000   CL 102 04/09/2013 1552   CL 110 02/09/2013 1000   CO2 28 04/09/2013 1552   CO2 22 02/09/2013 1000   BUN 18.1 04/09/2013 1552   BUN 11 02/09/2013 1000   CREATININE 0.9 04/09/2013 1552   CREATININE 0.80 02/09/2013 1000      Component Value Date/Time   CALCIUM 8.8 04/09/2013 1552   CALCIUM 7.7* 02/09/2013 1000   ALKPHOS 82 04/09/2013 1552   ALKPHOS 54 02/09/2013 1000   AST 22 04/09/2013 1552   AST 23 02/09/2013 1000   ALT 19 04/09/2013 1552   ALT 15 02/09/2013 1000   BILITOT 0.24 04/09/2013 1552   BILITOT 0.3 02/09/2013 1000       Lab Results  Component Value Date   WBC 7.2 04/09/2013   HGB 11.8 04/09/2013   HCT 36.3 04/09/2013   MCV 93.6 04/09/2013   PLT 266 04/09/2013   NEUTROABS 3.5 04/09/2013    Studies/Results: Mammography February 2014 was unremarkable. She is due for repeat bone density October of this year  Assessment: 67 y.o.  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  She was then on tamoxifen from January 2005 to November 2009, at  which point she was switched to letrozole.      Plan: She is going to see me in October after her repeat bone density, and we will make a decision at that time regarding further followup here, and whether she wants to continue antiestrogen Spivey on 10 years (for which there is very little data). She knows to call for any problems that may develop before the next visit.     Noeli Lavery C 04/16/2013

## 2013-04-20 ENCOUNTER — Telehealth: Payer: Self-pay | Admitting: Oncology

## 2013-04-20 NOTE — Telephone Encounter (Signed)
lmonvm for pt re appts for bone density 10/6, lab 10/20 and GM 10/27. Schedule for all appts mailed.

## 2013-09-14 ENCOUNTER — Ambulatory Visit
Admission: RE | Admit: 2013-09-14 | Discharge: 2013-09-14 | Disposition: A | Discharge status: Home / Self Care | Payer: 59 | Source: Ambulatory Visit | Attending: Oncology | Admitting: Oncology

## 2013-09-14 ENCOUNTER — Other Ambulatory Visit: Payer: Self-pay

## 2013-09-14 DIAGNOSIS — Z1231 Encounter for screening mammogram for malignant neoplasm of breast: Secondary | ICD-10-CM

## 2013-09-14 DIAGNOSIS — C50911 Malignant neoplasm of unspecified site of right female breast: Secondary | ICD-10-CM

## 2013-09-14 DIAGNOSIS — Z9011 Acquired absence of right breast and nipple: Secondary | ICD-10-CM

## 2013-09-28 ENCOUNTER — Other Ambulatory Visit (HOSPITAL_BASED_OUTPATIENT_CLINIC_OR_DEPARTMENT_OTHER): Payer: 59 | Admitting: Lab

## 2013-09-28 DIAGNOSIS — C50419 Malignant neoplasm of upper-outer quadrant of unspecified female breast: Secondary | ICD-10-CM

## 2013-09-28 DIAGNOSIS — C50911 Malignant neoplasm of unspecified site of right female breast: Secondary | ICD-10-CM

## 2013-09-28 LAB — CBC WITH DIFFERENTIAL/PLATELET
Basophils Absolute: 0.1 10*3/uL (ref 0.0–0.1)
EOS%: 4 % (ref 0.0–7.0)
HCT: 35.9 % (ref 34.8–46.6)
HGB: 11.6 g/dL (ref 11.6–15.9)
LYMPH%: 35.2 % (ref 14.0–49.7)
MCH: 30.5 pg (ref 25.1–34.0)
MCHC: 32.4 g/dL (ref 31.5–36.0)
MCV: 94.1 fL (ref 79.5–101.0)
MONO%: 11.8 % (ref 0.0–14.0)
NEUT%: 48 % (ref 38.4–76.8)
Platelets: 275 10*3/uL (ref 145–400)
lymph#: 2.5 10*3/uL (ref 0.9–3.3)

## 2013-09-28 LAB — COMPREHENSIVE METABOLIC PANEL (CC13)
AST: 21 U/L (ref 5–34)
BUN: 14.2 mg/dL (ref 7.0–26.0)
Calcium: 8.8 mg/dL (ref 8.4–10.4)
Chloride: 104 mEq/L (ref 98–109)
Creatinine: 1 mg/dL (ref 0.6–1.1)
Total Bilirubin: 0.27 mg/dL (ref 0.20–1.20)

## 2013-10-05 ENCOUNTER — Ambulatory Visit (HOSPITAL_BASED_OUTPATIENT_CLINIC_OR_DEPARTMENT_OTHER): Payer: 59 | Admitting: Oncology

## 2013-10-05 ENCOUNTER — Encounter (INDEPENDENT_AMBULATORY_CARE_PROVIDER_SITE_OTHER): Payer: Self-pay

## 2013-10-05 ENCOUNTER — Ambulatory Visit (HOSPITAL_COMMUNITY)
Admission: RE | Admit: 2013-10-05 | Discharge: 2013-10-05 | Disposition: A | Discharge status: Home / Self Care | Payer: 59 | Source: Ambulatory Visit | Attending: Oncology | Admitting: Oncology

## 2013-10-05 VITALS — BP 124/82 | HR 87 | Temp 98.6°F | Resp 18 | Ht 62.0 in | Wt 123.7 lb

## 2013-10-05 DIAGNOSIS — M542 Cervicalgia: Secondary | ICD-10-CM | POA: Insufficient documentation

## 2013-10-05 DIAGNOSIS — C50919 Malignant neoplasm of unspecified site of unspecified female breast: Secondary | ICD-10-CM

## 2013-10-05 DIAGNOSIS — Z853 Personal history of malignant neoplasm of breast: Secondary | ICD-10-CM

## 2013-10-05 NOTE — Progress Notes (Signed)
ID: Lucia Gaskins  DOB: 18-Dec-1945  MR#: [id number]  CSN#: [id number]  PCP: Kristian Covey, MD GYN: Malva Limes SU: OTHER MD: Chana Bode Nesi  Interval History:  Jeda returns today for followup of her breast cancer. The interval history is unremarkable. She continues to work at [employer]. She has stopped anti-estrogens, without noticing any particular change one way or the other in symptomatology. Unfortunately she has also stopped her vitamin D. and calcium supplementation.  ROS:  She has had pain in her neck on and off but it is more troublesome to her now and she is taking Advil intermittently for it. This is not related to any particular position and it can be more in the right side one day and more the left the next day and Sunday she doesn't have a lot of it. She is only walking at work, otherwise she does assume a once a week. She has pain in the left breast at times. This is very intermittent and very brief period hot flashes are "not bad" vaginal dryness is not an issue. A detailed review of systems today was otherwise noncontributory  Allergies  Allergen Reactions  . Theophyllines Rash    Current Outpatient Prescriptions  Medication Sig Dispense Refill  . loperamide (IMODIUM) 2 MG capsule Take 1 capsule (2 mg total) by mouth 4 (four) times daily as needed for diarrhea or loose stools.  12 capsule  0   No current facility-administered medications for this visit.     Objective: Middle-aged white woman in no acute distress  Filed Vitals:   10/05/13 1546  BP: 124/82  Pulse: 87  Temp: 98.6 F (37 C)  Resp: 18    BMI: Body mass index is 22.62 kg/(m^2).   ECOG FS: 1  Physical Exam:   Sclerae unicteric, pupils equal and reactive  Oropharynx no thrush or other lesions  No cervical or supraclavicular adenopathy  Lungs clear -- no rales or rhonchi  Heart regular rate and rhythm  Abdomen soft, nontender, positive bowel sounds  MSK no focal spinal tenderness including over  the neck area, normal range of motion in the neck, no upper extremity lymphedema  Neuro nonfocal, well oriented, pleasant affect  Breast exam right breast status post mastectomy; there is significant telangiectasia over the port area, as expected; there is no evidence of local recurrence. The right axilla is benign. Left breast is unremarkable  Lab Results:      Chemistry      Component Value Date/Time   NA 141 09/28/2013 1522   NA 140 02/09/2013 1000   K 4.2 09/28/2013 1522   K 3.6 02/09/2013 1000   CL 102 04/09/2013 1552   CL 110 02/09/2013 1000   CO2 27 09/28/2013 1522   CO2 22 02/09/2013 1000   BUN 14.2 09/28/2013 1522   BUN 11 02/09/2013 1000   CREATININE 1.0 09/28/2013 1522   CREATININE 0.80 02/09/2013 1000      Component Value Date/Time   CALCIUM 8.8 09/28/2013 1522   CALCIUM 7.7* 02/09/2013 1000   ALKPHOS 76 09/28/2013 1522   ALKPHOS 54 02/09/2013 1000   AST 21 09/28/2013 1522   AST 23 02/09/2013 1000   ALT 16 09/28/2013 1522   ALT 15 02/09/2013 1000   BILITOT 0.27 09/28/2013 1522   BILITOT 0.3 02/09/2013 1000       Lab Results  Component Value Date   WBC 7.2 09/28/2013   HGB 11.6 09/28/2013   HCT 35.9 09/28/2013   MCV 94.1 09/28/2013  PLT 275 09/28/2013   NEUTROABS 3.4 09/28/2013    Studies/Results: Study Result    *RADIOLOGY REPORT*  Clinical Data: 67 year old postmenopausal female with history of  right breast cancer and osteoporosis. The patient takes calcium  and vitamin D. She has taken Tamoxifen, Femara, and hormones in  the past.  DUAL X-RAY ABSORPTIOMETRY (DXA) FOR BONE MINERAL DENSITY  AP LUMBAR SPINE (L1 - L4)  Bone Mineral Density (BMD): 0.782 g/cm2  Young Adult T Score: -2.4  Z Score: -0.6  LEFT FEMUR (NECK)  Bone Mineral Density (BMD): 0.538 g/cm2  Young Adult T Score: -2.8  Z Score: -1.3  ASSESSMENT: Patient's diagnostic category is OSTEOPOROSIS by WHO  Criteria.  FRACTURE RISK: INCREASED  FRAX: World Health Organization FRAX assessment of absolute   fracture risk is not calculated for this patient because the  patient has osteoporosis.  Comparison: There has been a statistically significant 11.5%  decrease in BMD in the spine and no significant change in BMD in  the total left hip as compared to 04/30/2008. There has been a  statistically significant 4.2% decrease in BMD in the spine and no  significant change in BMD in the total left hip as compared to  09/14/2011.       Assessment: 67 y.o.  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  She was then on tamoxifen from January 2005 to November 2009, at which point she was switched to letrozole, discontinued in September 2014      Plan: Veleka has completed more than 10 years of followup for breast cancer and nearly 10 years of antiestrogen therapy. She is ready to "graduate". She understands that she will need a yearly physician breast and chest wall exam and yearly mammography. Of course we will be glad to see her at any point in the future if the need arises. However as of now we are making no further routine appointment for her here.  As a postscript, I am going to obtain plain films of her cervical spine this to make sure there is not something they her to worry about. I think this is going to be arthritis, I just want to make sure there is no instability or even a partial compression fracture. She will call tomorrow for those results.   MAGRINAT,GUSTAV C 10/05/2013

## 2013-10-06 ENCOUNTER — Telehealth: Payer: Self-pay | Admitting: Oncology

## 2013-10-06 NOTE — Telephone Encounter (Signed)
Sent letter to Dr. Percival Spanish from Dr. Bevelyn Buckles

## 2013-10-07 ENCOUNTER — Telehealth: Payer: Self-pay | Admitting: *Deleted

## 2013-10-07 NOTE — Telephone Encounter (Signed)
Patient calling for results of xray showing no acute abnormalities.  Degenerative changes only. Forwarded to Dr Caryl Never per patient request.

## 2014-01-04 ENCOUNTER — Ambulatory Visit (INDEPENDENT_AMBULATORY_CARE_PROVIDER_SITE_OTHER): Payer: 59 | Admitting: Podiatry

## 2014-01-04 ENCOUNTER — Encounter: Payer: Self-pay | Admitting: Podiatry

## 2014-01-04 VITALS — BP 138/87 | HR 94 | Resp 18

## 2014-01-04 DIAGNOSIS — B351 Tinea unguium: Secondary | ICD-10-CM

## 2014-01-04 NOTE — Progress Notes (Signed)
   Subjective:    Patient ID: Adriana Hill, female    DOB: 09/25/1946, 68 y.o.   MRN: [id number]  HPI 2nd toe on my right foot has a raised nail and has been going on for a year and bleed some when I cut on it and big toenails has fungus and has been going on since I had chemo 10 years ago and I have used over the counter fungal and there is a hard place on the bottom of my right foot and been going on for a year or so and hurts in the shower when it gets wet    Review of Systems  Constitutional: Negative.   HENT: Positive for sneezing.   Eyes: Negative.   Respiratory: Negative.   Cardiovascular: Negative.   Gastrointestinal: Positive for constipation.  Endocrine: Negative.   Genitourinary: Negative.   Allergic/Immunologic: Negative.   Neurological: Negative.   Hematological: Bruises/bleeds easily.       Objective:   Physical Exam Vascular: The DP and PTs are 2/4/ bilaterally.  Neurological: Deferred  Dermatological: Deformed second right toenail,  C shaped in appearance. The distal one third of the right and left hallux nails are discolored and have subungual debris. A nucleated keratoses plantar second MPJ noted.  Musculoskeletal: No deformities noted       Assessment & Plan:   Assessment: Mycotic second right toenail, and hallux nails bilaterally. Porokeratoses plantar right  Plan:I had a discussion with patient about treatment options including topical, oral, and laser therapy. At this time patient is interested in continuing with topical therapy. All 3 nails were debrided today advised patient to continue  applying topical medication on a daily basis. Please note patient had no interest in using oral medication or laser.

## 2014-01-05 ENCOUNTER — Encounter: Payer: Self-pay | Admitting: Podiatry

## 2014-01-06 ENCOUNTER — Other Ambulatory Visit: Payer: Self-pay

## 2014-01-06 DIAGNOSIS — Z1231 Encounter for screening mammogram for malignant neoplasm of breast: Secondary | ICD-10-CM

## 2014-01-06 DIAGNOSIS — Z9011 Acquired absence of right breast and nipple: Secondary | ICD-10-CM

## 2014-01-12 ENCOUNTER — Encounter: Payer: Self-pay | Admitting: Family Medicine

## 2014-01-12 ENCOUNTER — Ambulatory Visit (INDEPENDENT_AMBULATORY_CARE_PROVIDER_SITE_OTHER): Payer: 59 | Admitting: Family Medicine

## 2014-01-12 VITALS — BP 118/82 | HR 60 | Temp 98.9°F | Wt 126.0 lb

## 2014-01-12 DIAGNOSIS — J069 Acute upper respiratory infection, unspecified: Secondary | ICD-10-CM

## 2014-01-12 MED ORDER — BENZONATATE 100 MG PO CAPS: 100.0000 mg | ORAL_CAPSULE | Freq: Two times a day (BID) | ORAL | Status: DC | PRN

## 2014-01-12 MED ORDER — ALBUTEROL SULFATE HFA 108 (90 BASE) MCG/ACT IN AERS: 2.0000 | INHALATION_SPRAY | Freq: Four times a day (QID) | RESPIRATORY_TRACT | Status: DC | PRN

## 2014-01-12 NOTE — Patient Instructions (Signed)
-  use albuterol and cough medication as needed per instructions  -follow up as needed

## 2014-01-12 NOTE — Progress Notes (Signed)
Chief Complaint  Patient presents with  . Cough    chest congestion, headache, fever    HPI:  -started:5 days ago -symptoms:nasal congestion, sore throat, cough, low grade fevers, chest tightness, HA - improving somewhat but still with cough and had mild SOB last night -denies:fever,, NVD, tooth pain -sick contacts/travel/risks: denies flu exposure or Ebola risks -Hx of: allergies ROS: See pertinent positives and negatives per HPI.  Past Medical History  Diagnosis Date  . Blood in stool   . Cancer 2004    breast  . Allergy     hay fever    Past Surgical History  Procedure Laterality Date  . Mastectomy  2004    right    No family history on file.  History   Social History  . Marital Status: Single    Spouse Name: N/A    Number of Children: N/A  . Years of Education: N/A   Social History Main Topics  . Smoking status: Never Smoker   . Smokeless tobacco: Never Used  . Alcohol Use: Yes  . Drug Use: None  . Sexual Activity: None   Other Topics Concern  . None   Social History Narrative  . None    Current outpatient prescriptions:albuterol (PROVENTIL HFA;VENTOLIN HFA) 108 (90 BASE) MCG/ACT inhaler, Inhale 2 puffs into the lungs every 6 (six) hours as needed for wheezing or shortness of breath., Disp: 1 Inhaler, Rfl: 0;  benzonatate (TESSALON) 100 MG capsule, Take 1 capsule (100 mg total) by mouth 2 (two) times daily as needed for cough., Disp: 20 capsule, Rfl: 0 loperamide (IMODIUM) 2 MG capsule, Take 1 capsule (2 mg total) by mouth 4 (four) times daily as needed for diarrhea or loose stools., Disp: 12 capsule, Rfl: 0  EXAM:  Filed Vitals:   01/12/14 1415  BP: 118/82  Pulse: 60  Temp: 98.9 F (37.2 C)    Body mass index is 23.04 kg/(m^2).  GENERAL: vitals reviewed and listed above, alert, oriented, appears well hydrated and in no acute distress  HEENT: atraumatic, conjunttiva clear, no obvious abnormalities on inspection of external nose and ears,  normal appearance of ear canals and TMs, clear nasal congestion, mild post oropharyngeal erythema with PND, no tonsillar edema or exudate, no sinus TTP  NECK: no obvious masses on inspection  LUNGS: clear to auscultation bilaterally, no wheezes, rales or rhonchi, good air movement  CV: HRRR, no peripheral edema  MS: moves all extremities without noticeable abnormality  PSYCH: pleasant and cooperative, no obvious depression or anxiety  ASSESSMENT AND PLAN:  Discussed the following assessment and plan:  Upper respiratory infection - Plan: albuterol (PROVENTIL HFA;VENTOLIN HFA) 108 (90 BASE) MCG/ACT inhaler, benzonatate (TESSALON) 100 MG capsule  -given HPI and exam findings today, a serious infection or illness is unlikely. We discussed potential etiologies, with VURI being most likely, and advised supportive care and monitoring. We discussed treatment side effects, likely course, antibiotic misuse, transmission, and signs of developing a serious illness. -of course, we advised to return or notify a doctor immediately if symptoms worsen or persist or new concerns arise.    Patient Instructions  -use albuterol and cough medication as needed per instructions  -follow up as needed     Colin Benton R.

## 2014-01-12 NOTE — Progress Notes (Signed)
Pre visit review using our clinic review tool, if applicable. No additional management support is needed unless otherwise documented below in the visit note. 

## 2014-01-18 ENCOUNTER — Ambulatory Visit: Payer: 59

## 2014-01-28 ENCOUNTER — Encounter: Payer: Self-pay | Admitting: Family Medicine

## 2014-01-28 ENCOUNTER — Ambulatory Visit (INDEPENDENT_AMBULATORY_CARE_PROVIDER_SITE_OTHER): Payer: 59 | Admitting: Family Medicine

## 2014-01-28 VITALS — BP 114/80 | HR 90 | Temp 99.5°F | Wt 125.0 lb

## 2014-01-28 DIAGNOSIS — J329 Chronic sinusitis, unspecified: Secondary | ICD-10-CM

## 2014-01-28 MED ORDER — AZITHROMYCIN 250 MG PO TABS: ORAL_TABLET | ORAL | Status: AC

## 2014-01-28 NOTE — Patient Instructions (Signed)

## 2014-01-28 NOTE — Progress Notes (Signed)
Pre visit review using our clinic review tool, if applicable. No additional management support is needed unless otherwise documented below in the visit note. 

## 2014-01-28 NOTE — Progress Notes (Signed)
   Subjective:    Patient ID: Adriana Hill, female    DOB: 11/21/1946, 68 y.o.   MRN: [id number]  Cough Pertinent negatives include no chills or fever.   Acute visit for sinus congestion and cough. Onset around late January. She was seen here early February and diagnosed with likely viral URI. She was prescribed Tessalon and albuterol but never filled. Her cough has been mostly nonproductive. She gets some thick yellow mucus out of her sinuses the morning but clear later in the day. She has increased malaise. Intermittent mild headaches.  Past Medical History  Diagnosis Date  . Blood in stool   . Cancer 2004    breast  . Allergy     hay fever   Past Surgical History  Procedure Laterality Date  . Mastectomy  2004    right    reports that she has never smoked. She has never used smokeless tobacco. She reports that she drinks alcohol. Her drug history is not on file. family history is not on file. Allergies  Allergen Reactions  . Theophyllines Rash      Review of Systems  Constitutional: Positive for fatigue. Negative for fever and chills.  HENT: Positive for congestion and sinus pressure.   Respiratory: Positive for cough.        Objective:   Physical Exam  Constitutional: She appears well-developed and well-nourished.  HENT:  Right Ear: External ear normal.  Left Ear: External ear normal.  Mouth/Throat: Oropharynx is clear and moist.  Neck: Neck supple.  Cardiovascular: Normal rate.   Pulmonary/Chest: Effort normal and breath sounds normal. No respiratory distress. She has no wheezes. She has no rales.  Lymphadenopathy:    She has no cervical adenopathy.          Assessment & Plan:  Probable acute sinusitis. Given duration of symptoms, start Zithromax.

## 2014-02-02 ENCOUNTER — Telehealth: Payer: Self-pay | Admitting: Family Medicine

## 2014-02-02 ENCOUNTER — Ambulatory Visit: Payer: 59

## 2014-02-02 NOTE — Telephone Encounter (Signed)
Spoke to pt asked her when she was put on Augmentin? Pt stated she was seen at [facility] on Sunday was started on Augmentin and told to stop Z-pak due to not helping and cough was worse. Pt c/o diarrhea since Monday. Told pt stop Augmentin and I will check with Dr. Elease Hashimoto in the morning to see whether he wants her to finish Z-pak or give her something else and I will call her back. Pt verbalized understanding.

## 2014-02-02 NOTE — Telephone Encounter (Signed)
Pt was seen last Thursday, and also had to go to urgent care ([facility])on Sunday, pt states they put her on Augmentin (antibiotic) pt states she has diarrhea really bad and wants to know it she should discontinue the meds.

## 2014-02-02 NOTE — Telephone Encounter (Signed)
Could you answer for Dr Elease Hashimoto?

## 2014-02-03 ENCOUNTER — Ambulatory Visit (INDEPENDENT_AMBULATORY_CARE_PROVIDER_SITE_OTHER): Payer: 59 | Admitting: Family Medicine

## 2014-02-03 ENCOUNTER — Encounter: Payer: Self-pay | Admitting: Family Medicine

## 2014-02-03 VITALS — BP 118/72 | HR 90 | Temp 98.3°F | Wt 121.0 lb

## 2014-02-03 DIAGNOSIS — R059 Cough, unspecified: Secondary | ICD-10-CM

## 2014-02-03 DIAGNOSIS — R05 Cough: Secondary | ICD-10-CM

## 2014-02-03 MED ORDER — FLUCONAZOLE 150 MG PO TABS: 150.0000 mg | ORAL_TABLET | Freq: Once | ORAL | Status: DC

## 2014-02-03 NOTE — Telephone Encounter (Signed)
Stop Augmentin Unless she has any fever, I will stop all antibiotics at this time.  If diarrhea persists or she has any fever associated with diarrhea she is further testing to rule out C. difficile

## 2014-02-03 NOTE — Progress Notes (Signed)
Pre visit review using our clinic review tool, if applicable. No additional management support is needed unless otherwise documented below in the visit note. 

## 2014-02-03 NOTE — Progress Notes (Signed)
   Subjective:    Patient ID: Adriana Hill, female    DOB: August 17, 1946, 68 y.o.   MRN: [id number]  Cough Pertinent negatives include no chills, headaches, shortness of breath or wheezing.   Patient seen with persistent cough Refer to recent note: "Acute visit for sinus congestion and cough.  Onset around late January. She was seen here early February and diagnosed with likely viral URI. She was prescribed Tessalon and albuterol but never filled. Her cough has been mostly nonproductive. She gets some thick yellow mucus out of her sinuses the morning but clear later in the day. She has increased malaise. Intermittent mild headaches."  We started Zithromax and because of persistent cough she went to [facility] walk-in clinic on Sunday and was changed to Augmentin. She developed some fairly severe diarrhea and stopped the Augmentin on Tuesday. Her diarrhea has subsided at this time. She has not had any fever. No dyspnea. No wheezing. She was given cough suppressant which she is using at night. Patient is nonsmoker. No recent appetite or weight changes. No hemoptysis.  Past Medical History  Diagnosis Date  . Blood in stool   . Cancer 2004    breast  . Allergy     hay fever   Past Surgical History  Procedure Laterality Date  . Mastectomy  2004    right    reports that she has never smoked. She has never used smokeless tobacco. She reports that she drinks alcohol. Her drug history is not on file. family history is not on file. Allergies  Allergen Reactions  . Augmentin [Amoxicillin-Pot Clavulanate] Diarrhea  . Theophyllines Rash      Review of Systems  Constitutional: Negative for chills and unexpected weight change.  Respiratory: Positive for cough. Negative for shortness of breath and wheezing.   Gastrointestinal: Negative for nausea and vomiting.  Neurological: Negative for dizziness and headaches.       Objective:   Physical Exam  Constitutional: She appears well-developed and  well-nourished.  HENT:  Right Ear: External ear normal.  Left Ear: External ear normal.  Mouth/Throat: Oropharynx is clear and moist.  Neck: Neck supple.  Cardiovascular: Normal rate and regular rhythm.   Pulmonary/Chest: Effort normal and breath sounds normal. No respiratory distress. She has no wheezes. She has no rales.          Assessment & Plan:  Cough. Nonfocal exam. Recent diarrhea very likely secondary to Augmentin. Her symptoms are resolving at this time. We've recommended she continue cough suppressant at night. We have not recommended further antibiotics at this time. She does not have any clinical findings to suggest pneumonia. Consider chest x-ray if not improving in the next couple weeks and sooner as needed

## 2014-02-03 NOTE — Telephone Encounter (Signed)
Pt coming in to the office to be seen.

## 2014-02-03 NOTE — Patient Instructions (Signed)
Follow up promptly for any fever or increased shortness of breath Continue with cough suppressant as needed No further antibiotics at this time.

## 2014-03-10 ENCOUNTER — Ambulatory Visit
Admission: RE | Admit: 2014-03-10 | Discharge: 2014-03-10 | Disposition: A | Discharge status: Home / Self Care | Payer: 59 | Source: Ambulatory Visit

## 2014-03-10 DIAGNOSIS — Z9011 Acquired absence of right breast and nipple: Secondary | ICD-10-CM

## 2014-03-10 DIAGNOSIS — Z1231 Encounter for screening mammogram for malignant neoplasm of breast: Secondary | ICD-10-CM

## 2014-04-13 ENCOUNTER — Other Ambulatory Visit: Payer: Self-pay | Admitting: Oral Surgery

## 2014-08-17 ENCOUNTER — Ambulatory Visit (INDEPENDENT_AMBULATORY_CARE_PROVIDER_SITE_OTHER): Payer: Self-pay | Admitting: General Surgery

## 2014-12-21 ENCOUNTER — Other Ambulatory Visit: Payer: Self-pay

## 2014-12-21 ENCOUNTER — Other Ambulatory Visit: Payer: Self-pay | Admitting: Oncology

## 2014-12-21 DIAGNOSIS — Z853 Personal history of malignant neoplasm of breast: Secondary | ICD-10-CM

## 2014-12-21 DIAGNOSIS — Z1231 Encounter for screening mammogram for malignant neoplasm of breast: Secondary | ICD-10-CM

## 2014-12-21 DIAGNOSIS — Z9011 Acquired absence of right breast and nipple: Secondary | ICD-10-CM

## 2015-03-17 ENCOUNTER — Ambulatory Visit
Admission: RE | Admit: 2015-03-17 | Discharge: 2015-03-17 | Disposition: A | Discharge status: Home / Self Care | Payer: 59 | Source: Ambulatory Visit

## 2015-03-17 ENCOUNTER — Encounter (INDEPENDENT_AMBULATORY_CARE_PROVIDER_SITE_OTHER): Payer: Self-pay

## 2015-03-17 DIAGNOSIS — Z853 Personal history of malignant neoplasm of breast: Secondary | ICD-10-CM

## 2015-03-17 DIAGNOSIS — Z9011 Acquired absence of right breast and nipple: Secondary | ICD-10-CM

## 2015-03-17 DIAGNOSIS — Z1231 Encounter for screening mammogram for malignant neoplasm of breast: Secondary | ICD-10-CM

## 2015-04-18 ENCOUNTER — Encounter: Payer: Self-pay | Admitting: Family Medicine

## 2015-04-18 ENCOUNTER — Ambulatory Visit (INDEPENDENT_AMBULATORY_CARE_PROVIDER_SITE_OTHER): Payer: 59 | Admitting: Family Medicine

## 2015-04-18 VITALS — BP 128/70 | HR 90 | Temp 98.6°F | Wt 121.0 lb

## 2015-04-18 DIAGNOSIS — R1084 Generalized abdominal pain: Secondary | ICD-10-CM

## 2015-04-18 DIAGNOSIS — R109 Unspecified abdominal pain: Secondary | ICD-10-CM

## 2015-04-18 MED ORDER — DICYCLOMINE HCL 20 MG PO TABS: 20.0000 mg | ORAL_TABLET | Freq: Four times a day (QID) | ORAL | Status: DC | PRN

## 2015-04-18 NOTE — Progress Notes (Signed)
   Subjective:    Patient ID: Adriana Hill, female    DOB: 26-Sep-1946, 69 y.o.   MRN: [id number]  HPI Patient seen with abdominal pain. Onset yesterday. She describes bilateral somewhat poorly localized cramp-like pains which are intermittent. She's had about 5-6 loose stools yesterday and today. No bloody stools. No nausea or vomiting. Decreased appetite but keeping down fluids well. Occasional sharp pains throughout her abdomen. No fever. No recent travels. No recent antibiotic use. No history of similar symptoms in the past. She had colonoscopy 2009 normal. No dysuria. Symptoms are slightly improved today in terms of fewer stools and also less severe cramp like pains.  Past Medical History  Diagnosis Date  . Blood in stool   . Cancer 2004    breast  . Allergy     hay fever   Past Surgical History  Procedure Laterality Date  . Mastectomy  2004    right    reports that she has never smoked. She has never used smokeless tobacco. She reports that she drinks alcohol. Her drug history is not on file. family history is not on file. Allergies  Allergen Reactions  . Augmentin [Amoxicillin-Pot Clavulanate] Diarrhea  . Theophyllines Rash      Review of Systems  Constitutional: Negative for fever and chills.  Respiratory: Negative for shortness of breath.   Cardiovascular: Negative for chest pain.  Gastrointestinal: Positive for abdominal pain and diarrhea. Negative for nausea, vomiting and blood in stool.       Objective:   Physical Exam  Constitutional: She appears well-developed and well-nourished.  HENT:  Mouth/Throat: Oropharynx is clear and moist.  Cardiovascular: Normal rate and regular rhythm.   Pulmonary/Chest: Effort normal and breath sounds normal. No respiratory distress. She has no wheezes. She has no rales.  Abdominal: Soft. Bowel sounds are normal. She exhibits no distension and no mass. There is no tenderness. There is no rebound and no guarding.            Assessment & Plan:  Diffuse abdominal pain. She has frequent loose stools and suspect viral process. Nonacute abdomen. Dicyclomine 20 mg every 6-8 hours as needed for abdominal cramps. Hydrate well. Follow-up immediately for any fever or worsening symptoms.

## 2015-04-18 NOTE — Patient Instructions (Signed)

## 2015-04-18 NOTE — Progress Notes (Signed)
Pre visit review using our clinic review tool, if applicable. No additional management support is needed unless otherwise documented below in the visit note. 

## 2015-04-20 ENCOUNTER — Telehealth: Payer: Self-pay | Admitting: Family Medicine

## 2015-04-20 NOTE — Telephone Encounter (Signed)
Pt said Dr Elease Hashimoto told her to call back and let him know if she was still having issue. She said she is still having the pain and diarrhea

## 2015-04-20 NOTE — Telephone Encounter (Signed)
Pt stated that she has not taking the Dicyclomine. Informed patient that she will need to take the medication to see if it will help with diarrhea.

## 2015-04-20 NOTE — Telephone Encounter (Signed)
Did she try the Dicyclomine?

## 2015-04-21 ENCOUNTER — Ambulatory Visit (INDEPENDENT_AMBULATORY_CARE_PROVIDER_SITE_OTHER): Payer: 59 | Admitting: Family Medicine

## 2015-04-21 VITALS — BP 120/78 | HR 98 | Temp 98.4°F | Resp 16 | Ht 62.0 in | Wt 120.0 lb

## 2015-04-21 DIAGNOSIS — E860 Dehydration: Secondary | ICD-10-CM

## 2015-04-21 DIAGNOSIS — R197 Diarrhea, unspecified: Secondary | ICD-10-CM

## 2015-04-21 DIAGNOSIS — A084 Viral intestinal infection, unspecified: Secondary | ICD-10-CM

## 2015-04-21 LAB — POCT URINALYSIS DIPSTICK
BILIRUBIN UA: NEGATIVE
Glucose, UA: NEGATIVE
Ketones, UA: NEGATIVE
Leukocytes, UA: NEGATIVE
NITRITE UA: NEGATIVE
PH UA: 5.5
Protein, UA: NEGATIVE
Urobilinogen, UA: 0.2

## 2015-04-21 LAB — POCT CBC
GRANULOCYTE PERCENT: 65.8 % (ref 37–80)
HEMATOCRIT: 40.6 % (ref 37.7–47.9)
Hemoglobin: 13 g/dL (ref 12.2–16.2)
Lymph, poc: 3 (ref 0.6–3.4)
MCH, POC: 29.5 pg (ref 27–31.2)
MCHC: 31.9 g/dL (ref 31.8–35.4)
MCV: 92.3 fL (ref 80–97)
MID (CBC): 0.6 (ref 0–0.9)
MPV: 6.6 fL (ref 0–99.8)
POC GRANULOCYTE: 7 — AB (ref 2–6.9)
POC LYMPH PERCENT: 28.5 %L (ref 10–50)
POC MID %: 5.7 %M (ref 0–12)
Platelet Count, POC: 314 10*3/uL (ref 142–424)
RBC: 4.4 M/uL (ref 4.04–5.48)
RDW, POC: 13.9 %
WBC: 10.6 10*3/uL — AB (ref 4.6–10.2)

## 2015-04-21 NOTE — Patient Instructions (Addendum)
Your vitals and urine sample showed that you were not too dehydrated today. Your white blood cell count was slightly elevated.  We gave you 1 liter of IV fluid today.  If the diarrhea persists for another 2 days please obtain a stool sample and bring back to clinic. We will likely start you on an antibiotic at that time.  If you start having fevers, chills, blood in the stool, or severe abdominal pain please come back to see [place] or your PCP.  In the mean time, Imodium may help. You should take 4 mg initially then 2 mg after each unformed stool.  Bland foods may help. Be sure to stay hydrated. Continue the [employer].  Taking probiotics or cultured yogurt when you're better may help with recovery.

## 2015-04-21 NOTE — Progress Notes (Signed)
Subjective:    Patient ID: Adriana Hill, female    DOB: 1946-11-29, 69 y.o.   MRN: [id number]  Chief Complaint  Patient presents with  . Diarrhea  . Dehydration   Patient Active Problem List   Diagnosis Date Noted  . Breast cancer 04/15/2012   Prior to Admission medications   Medication Sig Start Date End Date Taking? Authorizing Provider  dicyclomine (BENTYL) 20 MG tablet Take 1 tablet (20 mg total) by mouth every 6 (six) hours as needed for spasms. 04/18/15  Yes Eulas Post, MD   Medications, allergies, past medical history, surgical history, family history, social history and problem list reviewed and updated.  HPI  22 yof presents with ongoing diarrhea and possible dehydration.   Was seen at [facility] primary care 04/18/15. Mild diffuse abd cramping, 5-6 non bloody stools day for 2 days, no n/v, no fevers/chills, no dysuria, no recent abx use. They suspected viral process with nonacute abdomen. Given dicyclomine for cramps. No lab work.   Today she states diarrhea has persisted. On Day 5 now. Still approx 5-6 loose stools day. She feels same as she had at 5/9 appt. No fevers, chills, no blood in stool, no n/v.   Review of Systems No cp, sob.     Objective:   Physical Exam  Constitutional: She is oriented to person, place, and time. She appears well-developed and well-nourished.  Non-toxic appearance. She does not have a sickly appearance. She does not appear ill. No distress.  BP 120/78 mmHg  Pulse 98  Temp(Src) 98.4 F (36.9 C) (Oral)  Resp 16  Ht 5\' 2"  (1.575 m)  Wt 120 lb (54.432 kg)  BMI 21.94 kg/m2  SpO2 98%   HENT:  Mouth/Throat: Mucous membranes are normal. Mucous membranes are not dry.  Pulmonary/Chest: Effort normal and breath sounds normal.  Abdominal: Soft. Normal appearance and bowel sounds are normal. She exhibits no mass. There is generalized tenderness. There is no rigidity, no rebound, no guarding, no CVA tenderness, no tenderness at McBurney's  point and negative Murphy's sign.  Mild generalized abd ttp.   Neurological: She is alert and oriented to person, place, and time.  Psychiatric: She has a normal mood and affect. Her speech is normal and behavior is normal.   Orthostatics:  Supine 111, 112/79 Seated 116, 121/84 Standing 120, 117/84  Results for orders placed or performed in visit on 04/21/15  POCT urinalysis dipstick  Result Value Ref Range   Color, UA yellow    Clarity, UA clear    Glucose, UA neg    Bilirubin, UA neg    Ketones, UA neg    Spec Grav, UA <=1.005    Blood, UA trace-lysed    pH, UA 5.5    Protein, UA neg    Urobilinogen, UA 0.2    Nitrite, UA neg    Leukocytes, UA Negative   POCT CBC  Result Value Ref Range   WBC 10.6 (A) 4.6 - 10.2 K/uL   Lymph, poc 3.0 0.6 - 3.4   POC LYMPH PERCENT 28.5 10 - 50 %L   MID (cbc) 0.6 0 - 0.9   POC MID % 5.7 0 - 12 %M   POC Granulocyte 7.0 (A) 2 - 6.9   Granulocyte percent 65.8 37 - 80 %G   RBC 4.40 4.04 - 5.48 M/uL   Hemoglobin 13.0 12.2 - 16.2 g/dL   HCT, POC 40.6 37.7 - 47.9 %   MCV 92.3 80 - 97 fL  MCH, POC 29.5 27 - 31.2 pg   MCHC 31.9 31.8 - 35.4 g/dL   RDW, POC 13.9 %   Platelet Count, POC 314 142 - 424 K/uL   MPV 6.6 0 - 99.8 fL      Assessment & Plan:   89 yof presents with ongoing diarrhea and possible dehydration.   Diarrhea - Plan: POCT urinalysis dipstick, POCT CBC, Stool culture, Fecal Lactoferrin Dehydration - Plan: POCT urinalysis dipstick, POCT CBC Viral gastroenteritis --exam reassuring with mild generalized abd ttp, no focal findings, normal workup 4 days ago at pcp and sx are unchanged since then --mild leukocytosis --no uti, no dehydration on ua and normal orthostatics --1L IVF given, pt felt more energy afterward --if diarrhea persists 48 hrs --> stool cx, fecal lactoferrin kits and return to clinic, will likely start abx after stool samples obtained if sx persist --imodium prn --BRAT diet, hydration, probiotics once  recovered --rtc asap if severe abd pain, blood in stool, fevers/chills  Julieta Gutting, PA-C Physician Assistant-Certified [facility]  04/21/2015 7:48 PM

## 2015-04-22 ENCOUNTER — Ambulatory Visit (INDEPENDENT_AMBULATORY_CARE_PROVIDER_SITE_OTHER): Payer: 59 | Admitting: Emergency Medicine

## 2015-04-22 ENCOUNTER — Ambulatory Visit (INDEPENDENT_AMBULATORY_CARE_PROVIDER_SITE_OTHER): Payer: 59

## 2015-04-22 VITALS — BP 142/78 | HR 73 | Temp 98.0°F | Resp 16 | Ht 62.0 in | Wt 120.0 lb

## 2015-04-22 DIAGNOSIS — R197 Diarrhea, unspecified: Secondary | ICD-10-CM | POA: Diagnosis not present

## 2015-04-22 DIAGNOSIS — E860 Dehydration: Secondary | ICD-10-CM

## 2015-04-22 DIAGNOSIS — R109 Unspecified abdominal pain: Secondary | ICD-10-CM | POA: Diagnosis not present

## 2015-04-22 DIAGNOSIS — R002 Palpitations: Secondary | ICD-10-CM

## 2015-04-22 NOTE — Patient Instructions (Signed)

## 2015-04-22 NOTE — Progress Notes (Signed)
   Subjective:    Patient ID: Adriana Hill, female    DOB: Nov 12, 1946, 69 y.o.   MRN: [id number] This chart was scribed for Arlyss Queen, MD by Marti Sleigh, Medical Scribe. This patient was seen in Room 7 and the patient's care was started at 12:28 PM.  Chief Complaint  Patient presents with  . Adverse Reaction To Medication    Pt took dicyclomine this a.m. states her heart feels like it is racing    HPI HPI Comments: Adriana Hill is a 69 y.o. female with a previous hx of right mastectomy due to breast cancer, who presents to [facility] complaining of rapid heart rate, weakness and disorientation that started this morning. Pt states she was seen at [place] primary care on 04/18/2015 for severe abdominal pain with persistent diarrhea, fatigue and possible dehydration for the last two days, and was prescribed dicyclomine. Pt then reported to [facility] yesterday for continued sx and was seen by Araceli Bouche. Pt was prescribed immodium prn, and given 1 liter of IV fluid, with improvement of fatigue. Pt denies recent abx use, long distance travel. Pt endorses recent sick contacts.   Review of Systems  Constitutional: Negative for fever and chills.  Cardiovascular:       Rapid heart rate  Gastrointestinal: Positive for diarrhea. Negative for blood in stool.  Neurological: Positive for weakness. Negative for dizziness.       Objective:   Physical Exam  Constitutional: She is oriented to person, place, and time. She appears well-developed and well-nourished. No distress.  HENT:  Head: Normocephalic and atraumatic.  Eyes: Pupils are equal, round, and reactive to light.  Neck: Neck supple.  Cardiovascular: Normal rate, regular rhythm and normal heart sounds.   No murmur heard. Pulmonary/Chest: Effort normal and breath sounds normal. No respiratory distress. She has no wheezes.  Abdominal: Soft. Bowel sounds are normal. She exhibits no mass. There is no tenderness.  Musculoskeletal: Normal range of  motion.  Neurological: She is alert and oriented to person, place, and time. Coordination normal.  Skin: Skin is warm and dry. She is not diaphoretic.  Psychiatric: She has a normal mood and affect. Her behavior is normal.  Nursing note and vitals reviewed.  [facility] reading (PRIMARY) by  Dr. Everlene Farrier. No obstruction, no free air. Negative acute abdominal series patient has had a right mastectomy.     Assessment & Plan:

## 2015-04-23 LAB — FECAL LACTOFERRIN, QUANT: LACTOFERRIN: POSITIVE

## 2015-04-25 LAB — CRYPTOSPORIDIUM ANTIGEN, STOOL: Cryptosporidium Screen (EIA): NEGATIVE

## 2015-04-26 LAB — STOOL CULTURE

## 2015-05-02 NOTE — Progress Notes (Signed)
Patient ID: Adriana Hill, female   DOB: Feb 29, 1946, 69 y.o.   MRN: [id number] Reviewed documentation and agree w/ assessment and plan. Delman Cheadle, MD MPH

## 2015-09-23 ENCOUNTER — Ambulatory Visit (INDEPENDENT_AMBULATORY_CARE_PROVIDER_SITE_OTHER): Payer: Managed Care, Other (non HMO) | Admitting: Urgent Care

## 2015-09-23 VITALS — BP 114/64 | HR 96 | Temp 98.8°F | Resp 18 | Ht 62.0 in | Wt 123.0 lb

## 2015-09-23 DIAGNOSIS — R0981 Nasal congestion: Secondary | ICD-10-CM

## 2015-09-23 DIAGNOSIS — J029 Acute pharyngitis, unspecified: Secondary | ICD-10-CM | POA: Diagnosis not present

## 2015-09-23 DIAGNOSIS — R490 Dysphonia: Secondary | ICD-10-CM | POA: Diagnosis not present

## 2015-09-23 DIAGNOSIS — J069 Acute upper respiratory infection, unspecified: Secondary | ICD-10-CM | POA: Diagnosis not present

## 2015-09-23 MED ORDER — AZITHROMYCIN 250 MG PO TABS: ORAL_TABLET | ORAL | Status: DC

## 2015-09-23 NOTE — Patient Instructions (Signed)
Upper Respiratory Infection, Adult Most upper respiratory infections (URIs) are a viral infection of the air passages leading to the lungs. A URI affects the nose, throat, and upper air passages. The most common type of URI is nasopharyngitis and is typically referred to as "the common cold." URIs run their course and usually go away on their own. Most of the time, a URI does not require medical attention, but sometimes a bacterial infection in the upper airways can follow a viral infection. This is called a secondary infection. Sinus and middle ear infections are common types of secondary upper respiratory infections. Bacterial pneumonia can also complicate a URI. A URI can worsen asthma and chronic obstructive pulmonary disease (COPD). Sometimes, these complications can require emergency medical care and may be life threatening.  CAUSES Almost all URIs are caused by viruses. A virus is a type of germ and can spread from one person to another.  RISKS FACTORS You may be at risk for a URI if:   You smoke.   You have chronic heart or lung disease.  You have a weakened defense (immune) system.   You are very young or very old.   You have nasal allergies or asthma.  You work in crowded or poorly ventilated areas.  You work in health care facilities or schools. SIGNS AND SYMPTOMS  Symptoms typically develop 2-3 days after you come in contact with a cold virus. Most viral URIs last 7-10 days. However, viral URIs from the influenza virus (flu virus) can last 14-18 days and are typically more severe. Symptoms may include:   Runny or stuffy (congested) nose.   Sneezing.   Cough.   Sore throat.   Headache.   Fatigue.   Fever.   Loss of appetite.   Pain in your forehead, behind your eyes, and over your cheekbones (sinus pain).  Muscle aches.  DIAGNOSIS  Your health care provider may diagnose a URI by:  Physical exam.  Tests to check that your symptoms are not due to  another condition such as:  Strep throat.  Sinusitis.  Pneumonia.  Asthma. TREATMENT  A URI goes away on its own with time. It cannot be cured with medicines, but medicines may be prescribed or recommended to relieve symptoms. Medicines may help:  Reduce your fever.  Reduce your cough.  Relieve nasal congestion. HOME CARE INSTRUCTIONS   Take medicines only as directed by your health care provider.   Gargle warm saltwater or take cough drops to comfort your throat as directed by your health care provider.  Use a warm mist humidifier or inhale steam from a shower to increase air moisture. This may make it easier to breathe.  Drink enough fluid to keep your urine clear or pale yellow.   Eat soups and other clear broths and maintain good nutrition.   Rest as needed.   Return to work when your temperature has returned to normal or as your health care provider advises. You may need to stay home longer to avoid infecting others. You can also use a face mask and careful hand washing to prevent spread of the virus.  Increase the usage of your inhaler if you have asthma.   Do not use any tobacco products, including cigarettes, chewing tobacco, or electronic cigarettes. If you need help quitting, ask your health care provider. PREVENTION  The best way to protect yourself from getting a cold is to practice good hygiene.   Avoid oral or hand contact with people with cold   symptoms.   Wash your hands often if contact occurs.  There is no clear evidence that vitamin C, vitamin E, echinacea, or exercise reduces the chance of developing a cold. However, it is always recommended to get plenty of rest, exercise, and practice good nutrition.  SEEK MEDICAL CARE IF:   You are getting worse rather than better.   Your symptoms are not controlled by medicine.   You have chills.  You have worsening shortness of breath.  You have brown or red mucus.  You have yellow or brown nasal  discharge.  You have pain in your face, especially when you bend forward.  You have a fever.  You have swollen neck glands.  You have pain while swallowing.  You have white areas in the back of your throat. SEEK IMMEDIATE MEDICAL CARE IF:   You have severe or persistent:  Headache.  Ear pain.  Sinus pain.  Chest pain.  You have chronic lung disease and any of the following:  Wheezing.  Prolonged cough.  Coughing up blood.  A change in your usual mucus.  You have a stiff neck.  You have changes in your:  Vision.  Hearing.  Thinking.  Mood. MAKE SURE YOU:   Understand these instructions.  Will watch your condition.  Will get help right away if you are not doing well or get worse.   This information is not intended to replace advice given to you by your health care provider. Make sure you discuss any questions you have with your health care provider.   Document Released: 05/22/2001 Document Revised: 04/12/2015 Document Reviewed: 03/03/2014 Elsevier Interactive Patient Education 2016 Elsevier Inc.  

## 2015-09-23 NOTE — Progress Notes (Signed)
    MRN: [id number] DOB: Nov 09, 1946  Subjective:   Adriana Hill is a 69 y.o. female presenting for chief complaint of Fever; Cough; Nasal Congestion; and Sore Throat  Reports 1 week history of sinus congestion, pressure, mild sinus pain, hoarseness, tickle in her throat, low-grade fever (99.72F was highest). Has tried Delsym with some relief of cough. Multiple with sick contacts, grandchildren, adult daughter. Denies chest pain, chest congestion, shortness of breath, wheezing, nausea, vomiting, abdominal pain. Patient also denies ear pain, ear drainage, itchy or red eyes, tooth pain. Denies smoking cigarettes. Denies history of seasonal allergies. Denies any other aggravating or relieving factors, no other questions or concerns.  Adriana Hill has a current medication list which includes the following prescription(s): dicyclomine. Also is allergic to dicyclomine hcl; flagyl; augmentin; and theophyllines.  Adriana Hill  has a past medical history of Blood in stool; Cancer [facility]) (2004); and Allergy. Also  has past surgical history that includes Mastectomy (2004).  Objective:   Vitals: BP 114/64 mmHg  Pulse 96  Temp(Src) 98.8 F (37.1 C) (Oral)  Resp 18  Ht 5\' 2"  (1.575 m)  Wt 123 lb (55.792 kg)  BMI 22.49 kg/m2  SpO2 97%  Physical Exam  Constitutional: She is oriented to person, place, and time. She appears well-developed and well-nourished.  HENT:  TM's intact bilaterally, no effusions or erythema. Nares patent, nasal turbinates pink and moist, nasal passages patent. No sinus tenderness. Oropharynx clear, mucous membranes moist, dentition in good repair.  Eyes: Right eye exhibits no discharge. Left eye exhibits no discharge. No scleral icterus.  Neck: Normal range of motion. Neck supple.  Cardiovascular: Normal rate, regular rhythm and intact distal pulses.  Exam reveals no gallop and no friction rub.   No murmur heard. Pulmonary/Chest: No respiratory distress. She has no wheezes. She has no rales.   Musculoskeletal: She exhibits no edema.  Lymphadenopathy:    She has no cervical adenopathy.  Neurological: She is alert and oriented to person, place, and time.  Skin: Skin is warm and dry. No rash noted. No erythema. No pallor.   Assessment and Plan :   1. Upper respiratory infection 2. Sinus congestion 3. Sore throat 4. Hoarseness - Patient is worried about bacterial infection, advised her that she should continue using Delsym and wait a few more days to see if her symptoms resolve on their own. I did provided patient with a printed script for azithromycin, she is to start this if her symptoms do not improve in 3 days. RTC if no improvement despite azithromycin course.  Jaynee Eagles, PA-C [facility] [phone] 09/23/2015 5:41 PM

## 2016-02-08 ENCOUNTER — Other Ambulatory Visit: Payer: Self-pay

## 2016-02-08 DIAGNOSIS — Z1231 Encounter for screening mammogram for malignant neoplasm of breast: Secondary | ICD-10-CM

## 2016-03-06 ENCOUNTER — Other Ambulatory Visit (INDEPENDENT_AMBULATORY_CARE_PROVIDER_SITE_OTHER): Payer: Managed Care, Other (non HMO)

## 2016-03-06 DIAGNOSIS — Z0000 Encounter for general adult medical examination without abnormal findings: Secondary | ICD-10-CM

## 2016-03-06 LAB — HEPATIC FUNCTION PANEL
ALBUMIN: 4 g/dL (ref 3.5–5.2)
ALT: 16 U/L (ref 0–35)
AST: 22 U/L (ref 0–37)
Alkaline Phosphatase: 65 U/L (ref 39–117)
Bilirubin, Direct: 0.1 mg/dL (ref 0.0–0.3)
Total Bilirubin: 0.6 mg/dL (ref 0.2–1.2)
Total Protein: 7.3 g/dL (ref 6.0–8.3)

## 2016-03-06 LAB — CBC WITH DIFFERENTIAL/PLATELET
BASOS ABS: 0 10*3/uL (ref 0.0–0.1)
Basophils Relative: 0.4 % (ref 0.0–3.0)
EOS ABS: 0.3 10*3/uL (ref 0.0–0.7)
Eosinophils Relative: 4.3 % (ref 0.0–5.0)
HCT: 38.6 % (ref 36.0–46.0)
Hemoglobin: 12.6 g/dL (ref 12.0–15.0)
LYMPHS ABS: 3 10*3/uL (ref 0.7–4.0)
Lymphocytes Relative: 39.6 % (ref 12.0–46.0)
MCHC: 32.8 g/dL (ref 30.0–36.0)
MCV: 92 fl (ref 78.0–100.0)
Monocytes Absolute: 0.8 10*3/uL (ref 0.1–1.0)
Monocytes Relative: 10.2 % (ref 3.0–12.0)
NEUTROS ABS: 3.4 10*3/uL (ref 1.4–7.7)
NEUTROS PCT: 45.5 % (ref 43.0–77.0)
PLATELETS: 292 10*3/uL (ref 150.0–400.0)
RBC: 4.19 Mil/uL (ref 3.87–5.11)
RDW: 13.2 % (ref 11.5–15.5)
WBC: 7.6 10*3/uL (ref 4.0–10.5)

## 2016-03-06 LAB — LIPID PANEL
Cholesterol: 232 mg/dL — ABNORMAL HIGH (ref 0–200)
HDL: 53.8 mg/dL (ref >39.00)
LDL CALC: 157 mg/dL — AB (ref 0–99)
NONHDL: 177.79
Total CHOL/HDL Ratio: 4
Triglycerides: 105 mg/dL (ref 0.0–149.0)
VLDL: 21 mg/dL (ref 0.0–40.0)

## 2016-03-06 LAB — BASIC METABOLIC PANEL
BUN: 19 mg/dL (ref 6–23)
CALCIUM: 9.1 mg/dL (ref 8.4–10.5)
CO2: 27 meq/L (ref 19–32)
CREATININE: 0.81 mg/dL (ref 0.40–1.20)
Chloride: 104 mEq/L (ref 96–112)
GFR: 74.76 mL/min (ref >60.00)
GLUCOSE: 96 mg/dL (ref 70–99)
Potassium: 4.1 mEq/L (ref 3.5–5.1)
Sodium: 139 mEq/L (ref 135–145)

## 2016-03-06 LAB — TSH: TSH: 3.63 u[IU]/mL (ref 0.35–4.50)

## 2016-03-15 ENCOUNTER — Encounter: Payer: Self-pay | Admitting: Family Medicine

## 2016-03-15 ENCOUNTER — Ambulatory Visit (INDEPENDENT_AMBULATORY_CARE_PROVIDER_SITE_OTHER): Payer: Managed Care, Other (non HMO) | Admitting: Family Medicine

## 2016-03-15 VITALS — BP 108/80 | HR 112 | Temp 98.2°F | Ht 62.0 in | Wt 121.2 lb

## 2016-03-15 DIAGNOSIS — Z0000 Encounter for general adult medical examination without abnormal findings: Secondary | ICD-10-CM | POA: Diagnosis not present

## 2016-03-15 NOTE — Progress Notes (Signed)
Pre visit review using our clinic review tool, if applicable. No additional management support is needed unless otherwise documented below in the visit note. 

## 2016-03-15 NOTE — Progress Notes (Signed)
Subjective:    Patient ID: Adriana Hill, female    DOB: 04-24-1946, 70 y.o.   MRN: [id number]  HPI Patient here for physical exam. She has a wrote history of breast cancer 12 years ago. She is currently getting yearly mammograms has pending appointment for mammogram in May. Followed by gyn.  She has history of osteoporosis and was briefly cheated with bisphosphonate taking off because of prior extensive radiation therapy. Last colonoscopy 2009 normal. No history of shingles vaccine. Last tetanus unknown. She thinks less than 10 years ago. No history of Prevnar or Pneumovax.  She is adopted so family history is unknown. His never smoked. She walks some for exercise. No history of reported fractures. Overall feels well with no specific complaints today.  Past Medical History  Diagnosis Date  . Blood in stool   . Cancer [facility]) 2004    breast  . Allergy     hay fever   Past Surgical History  Procedure Laterality Date  . Mastectomy  2004    right    reports that she has never smoked. She has never used smokeless tobacco. She reports that she drinks alcohol. Her drug history is not on file. family history is not on file. Allergies  Allergen Reactions  . Dicyclomine Hcl Palpitations    Patient had a reaction of tachycardia palpitations 30 minutes after taking medication  . Flagyl [Metronidazole] Anaphylaxis  . Augmentin [Amoxicillin-Pot Clavulanate] Diarrhea  . Theophyllines Rash      Review of Systems  Constitutional: Negative for fever, activity change, appetite change, fatigue and unexpected weight change.  HENT: Negative for ear pain, hearing loss, sore throat and trouble swallowing.   Eyes: Negative for visual disturbance.  Respiratory: Negative for cough and shortness of breath.   Cardiovascular: Negative for chest pain and palpitations.  Gastrointestinal: Negative for abdominal pain, diarrhea, constipation and blood in stool.  Endocrine: Negative for polydipsia and  polyuria.  Genitourinary: Negative for dysuria and hematuria.  Musculoskeletal: Negative for myalgias, back pain and arthralgias.  Skin: Negative for rash.  Neurological: Negative for dizziness, syncope and headaches.  Hematological: Negative for adenopathy.  Psychiatric/Behavioral: Negative for confusion and dysphoric mood.       Objective:   Physical Exam  Constitutional: She is oriented to person, place, and time. She appears well-developed and well-nourished.  HENT:  Head: Normocephalic and atraumatic.  Eyes: EOM are normal. Pupils are equal, round, and reactive to light.  Neck: Normal range of motion. Neck supple. No thyromegaly present.  Cardiovascular: Normal rate, regular rhythm and normal heart sounds.   No murmur heard. Pulmonary/Chest: Breath sounds normal. No respiratory distress. She has no wheezes. She has no rales.  Abdominal: Soft. Bowel sounds are normal. She exhibits no distension and no mass. There is no tenderness. There is no rebound and no guarding.  Genitourinary:  Per GYN  Musculoskeletal: Normal range of motion. She exhibits no edema.  Lymphadenopathy:    She has no cervical adenopathy.  Neurological: She is alert and oriented to person, place, and time. She displays normal reflexes. No cranial nerve deficit.  Skin: No rash noted.  Psychiatric: She has a normal mood and affect. Her behavior is normal. Judgment and thought content normal.          Assessment & Plan:  Physical exam. Several health maintenance issues addressed. She is reluctant to take anything pharmacologically for osteoporosis. She will discuss with GYN. She is encouraged to take daily calcium 1200 mg and vitamin  D 1000 international units. Repeat colonoscopy in 2 years. Recommend Prevnar 13 and she wishes to return later because of time constraints today. Check on coverage for shingles vaccine. Confirm date of last tetanus. Labs reviewed. No major concerns. Mild hyperlipidemia. 10 year  risk of CAD event 5.4%. No recommendation for statin at this time

## 2016-03-15 NOTE — Patient Instructions (Signed)
Confirm date of last tetanus. Check on coverage for shingles vaccine We need to consider Pneumovax in one year.  Continue weight bearing exercise Daily calcium 1,200 mg and Vit D 1,000

## 2016-03-19 ENCOUNTER — Ambulatory Visit: Payer: 59

## 2016-03-26 ENCOUNTER — Ambulatory Visit
Admission: RE | Admit: 2016-03-26 | Discharge: 2016-03-26 | Disposition: A | Discharge status: Home / Self Care | Payer: Managed Care, Other (non HMO) | Source: Ambulatory Visit

## 2016-03-26 DIAGNOSIS — Z1231 Encounter for screening mammogram for malignant neoplasm of breast: Secondary | ICD-10-CM

## 2017-01-03 ENCOUNTER — Ambulatory Visit (INDEPENDENT_AMBULATORY_CARE_PROVIDER_SITE_OTHER): Payer: BLUE CROSS/BLUE SHIELD | Admitting: Family Medicine

## 2017-01-03 ENCOUNTER — Encounter: Payer: Self-pay | Admitting: Family Medicine

## 2017-01-03 VITALS — BP 124/82 | HR 94 | Temp 98.4°F | Wt 121.2 lb

## 2017-01-03 DIAGNOSIS — R52 Pain, unspecified: Secondary | ICD-10-CM

## 2017-01-03 DIAGNOSIS — R6889 Other general symptoms and signs: Secondary | ICD-10-CM | POA: Diagnosis not present

## 2017-01-03 LAB — POCT INFLUENZA A: Rapid Influenza A Ag: NEGATIVE

## 2017-01-03 NOTE — Progress Notes (Signed)
Pre visit review using our clinic review tool, if applicable. No additional management support is needed unless otherwise documented below in the visit note. 

## 2017-01-03 NOTE — Patient Instructions (Signed)
Your symptoms are most likely related to a viral illness. Please drink plenty of water so that your urine is pale yellow or clear. Also, get plenty of rest, use tylenol  as needed for discomfort and follow up if symptoms do not improve in 3 to 4 days, worsen, or you develop a fever >101.  Influenza, Adult Influenza ("the flu") is an infection in the lungs, nose, and throat (respiratory tract). It is caused by a virus. The flu causes many common cold symptoms, as well as a high fever and body aches. It can make you feel very sick. The flu spreads easily from person to person (is contagious). Getting a flu shot (influenza vaccination) every year is the best way to prevent the flu. Follow these instructions at home:  Take over-the-counter and prescription medicines only as told by your doctor.  Use a cool mist humidifier to add moisture (humidity) to the air in your home. This can make it easier to breathe.  Rest as needed.  Drink enough fluid to keep your pee (urine) clear or pale yellow.  Cover your mouth and nose when you cough or sneeze.  Wash your hands with soap and water often, especially after you cough or sneeze. If you cannot use soap and water, use hand sanitizer.  Stay home from work or school as told by your doctor. Unless you are visiting your doctor, try to avoid leaving home until your fever has been gone for 24 hours without the use of medicine.  Keep all follow-up visits as told by your doctor. This is important. How is this prevented?  Getting a yearly (annual) flu shot is the best way to avoid getting the flu. You may get the flu shot in late summer, fall, or winter. Ask your doctor when you should get your flu shot.  Wash your hands often or use hand sanitizer often.  Avoid contact with people who are sick during cold and flu season.  Eat healthy foods.  Drink plenty of fluids.  Get enough sleep.  Exercise regularly. Contact a doctor if:  You get new  symptoms.  You have:  Chest pain.  Watery poop (diarrhea).  A fever.  Your cough gets worse.  You start to have more mucus.  You feel sick to your stomach (nauseous).  You throw up (vomit). Get help right away if:  You start to be short of breath or have trouble breathing.  Your skin or nails turn a bluish color.  You have very bad pain or stiffness in your neck.  You get a sudden headache.  You get sudden pain in your face or ear.  You cannot stop throwing up. This information is not intended to replace advice given to you by your health care provider. Make sure you discuss any questions you have with your health care provider. Document Released: 09/04/2008 Document Revised: 05/03/2016 Document Reviewed: 09/20/2015 Elsevier Interactive Patient Education  2017 [employer].

## 2017-01-03 NOTE — Progress Notes (Signed)
Subjective:    Patient ID: Adriana Hill, female    DOB: 05/05/1946, 71 y.o.   MRN: [id number]  HPI  Ms. Adriana Hill is a 71 year old female who presents with loose stools that started 4 days ago. She reports that her loose stools were improving however they are loose again today. Associated dull headache, nausea, generalized body aches, rhinitis with clear drainage, chills.  She denies sweats, sinus pressure/pain, ear pain, tooth pain, or cough.  She works at [employer] and reports recent sick contact exposure with flu.  She is UTD with influenza vaccine. No recent antibiotic use in the past 30 days. No history asthma/bonchitis. She is a nonsmoker. Treatment at home includes MOM before symptoms started prior to symptoms for constipation.   Review of Systems  Constitutional: Positive for chills. Negative for fatigue and fever.  HENT: Positive for congestion, postnasal drip and rhinorrhea. Negative for ear pain, sinus pain, sinus pressure, sneezing and sore throat.   Respiratory: Negative for cough and wheezing.   Cardiovascular: Negative for chest pain and palpitations.  Gastrointestinal: Positive for diarrhea and nausea. Negative for abdominal pain.  Musculoskeletal: Positive for myalgias.  Neurological: Positive for headaches. Negative for dizziness.   Past Medical History:  Diagnosis Date  . Allergy    hay fever  . Blood in stool   . Cancer [facility]) 2004   breast     Social History   Social History  . Marital status: Single    Spouse name: N/A  . Number of children: N/A  . Years of education: N/A   Occupational History  . Not on file.   Social History Main Topics  . Smoking status: Never Smoker  . Smokeless tobacco: Never Used  . Alcohol use Yes  . Drug use: Unknown  . Sexual activity: Not on file   Other Topics Concern  . Not on file   Social History Narrative  . No narrative on file    Past Surgical History:  Procedure Laterality Date  . MASTECTOMY  2004   right     No family history on file.  Allergies  Allergen Reactions  . Dicyclomine Hcl Palpitations    Patient had a reaction of tachycardia palpitations 30 minutes after taking medication  . Flagyl [Metronidazole] Anaphylaxis  . Augmentin [Amoxicillin-Pot Clavulanate] Diarrhea  . Theophyllines Rash    No current outpatient prescriptions on file prior to visit.   No current facility-administered medications on file prior to visit.     BP 124/82 (BP Location: Left Arm, Patient Position: Sitting, Cuff Size: Normal)   Pulse 94   Temp 98.4 F (36.9 C) (Oral)   Wt 121 lb 3.2 oz (55 kg)   SpO2 98%   BMI 22.17 kg/m       Objective:   Physical Exam  Constitutional: She is oriented to person, place, and time. She appears well-developed and well-nourished.  HENT:  Nose: Rhinorrhea present. Right sinus exhibits no maxillary sinus tenderness and no frontal sinus tenderness. Left sinus exhibits no maxillary sinus tenderness and no frontal sinus tenderness.  Mouth/Throat: Mucous membranes are normal. No oropharyngeal exudate or posterior oropharyngeal erythema.  Eyes: Pupils are equal, round, and reactive to light. No scleral icterus.  Neck: Neck supple.  Cardiovascular: Normal rate and regular rhythm.   Pulmonary/Chest: Effort normal and breath sounds normal. She has no wheezes. She has no rales.  Abdominal: Soft. Bowel sounds are normal. There is no tenderness.  Lymphadenopathy:    She has cervical  adenopathy.  Neurological: She is alert and oriented to person, place, and time.  Skin: Skin is warm and dry. No rash noted.          Assessment & Plan:  1. Flu-like symptoms POC Influenza negative: Suspect that symptoms are viral in nature. We discussed the possibility of a false negative on this flu test.  Advised patient on supportive measures:  Get rest, drink plenty of fluids, and use tylenol or ibuprofen as needed for pain. Follow up if fever >101, if symptoms worsen or if symptoms  are not improved in 3 days. Patient verbalizes understanding.    2. Body aches Flu negative. Supportive treatment recommended. - POCT Influenza A  Delano Metz, FNP-C

## 2017-02-11 ENCOUNTER — Emergency Department (HOSPITAL_COMMUNITY)
Admission: EM | Admit: 2017-02-11 | Discharge: 2017-02-11 | Disposition: A | Discharge status: Home / Self Care | Payer: BLUE CROSS/BLUE SHIELD | Attending: Emergency Medicine | Admitting: Emergency Medicine

## 2017-02-11 ENCOUNTER — Encounter (HOSPITAL_COMMUNITY): Payer: Self-pay

## 2017-02-11 DIAGNOSIS — Z853 Personal history of malignant neoplasm of breast: Secondary | ICD-10-CM | POA: Insufficient documentation

## 2017-02-11 DIAGNOSIS — R197 Diarrhea, unspecified: Secondary | ICD-10-CM | POA: Diagnosis present

## 2017-02-11 DIAGNOSIS — E860 Dehydration: Secondary | ICD-10-CM | POA: Insufficient documentation

## 2017-02-11 LAB — URINALYSIS, ROUTINE W REFLEX MICROSCOPIC
BILIRUBIN URINE: NEGATIVE
Glucose, UA: NEGATIVE mg/dL
Ketones, ur: NEGATIVE mg/dL
LEUKOCYTES UA: NEGATIVE
NITRITE: NEGATIVE
PROTEIN: NEGATIVE mg/dL
SPECIFIC GRAVITY, URINE: 1.003 — AB (ref 1.005–1.030)
pH: 6 (ref 5.0–8.0)

## 2017-02-11 LAB — LIPASE, BLOOD: LIPASE: 38 U/L (ref 11–51)

## 2017-02-11 LAB — CBC
HEMATOCRIT: 40.8 % (ref 36.0–46.0)
HEMOGLOBIN: 13.3 g/dL (ref 12.0–15.0)
MCH: 30.6 pg (ref 26.0–34.0)
MCHC: 32.6 g/dL (ref 30.0–36.0)
MCV: 94 fL (ref 78.0–100.0)
Platelets: 287 10*3/uL (ref 150–400)
RBC: 4.34 MIL/uL (ref 3.87–5.11)
RDW: 13.2 % (ref 11.5–15.5)
WBC: 6.9 10*3/uL (ref 4.0–10.5)

## 2017-02-11 LAB — COMPREHENSIVE METABOLIC PANEL
ALBUMIN: 4 g/dL (ref 3.5–5.0)
ALT: 20 U/L (ref 14–54)
ANION GAP: 7 (ref 5–15)
AST: 28 U/L (ref 15–41)
Alkaline Phosphatase: 50 U/L (ref 38–126)
BUN: 11 mg/dL (ref 6–20)
CHLORIDE: 107 mmol/L (ref 101–111)
CO2: 23 mmol/L (ref 22–32)
Calcium: 8.6 mg/dL — ABNORMAL LOW (ref 8.9–10.3)
Creatinine, Ser: 0.97 mg/dL (ref 0.44–1.00)
GFR calc Af Amer: >60 mL/min (ref >60)
GFR calc non Af Amer: 59 mL/min — ABNORMAL LOW (ref >60)
GLUCOSE: 108 mg/dL — AB (ref 65–99)
POTASSIUM: 3.5 mmol/L (ref 3.5–5.1)
Sodium: 137 mmol/L (ref 135–145)
TOTAL PROTEIN: 7.7 g/dL (ref 6.5–8.1)
Total Bilirubin: 0.8 mg/dL (ref 0.3–1.2)

## 2017-02-11 MED ORDER — SODIUM CHLORIDE 0.9 % IV BOLUS (SEPSIS)
500.0000 mL | Freq: Once | INTRAVENOUS | Status: AC
  Administered 2017-02-11: 500 mL via INTRAVENOUS

## 2017-02-11 MED ORDER — SODIUM CHLORIDE 0.9 % IV BOLUS (SEPSIS)
1000.0000 mL | Freq: Once | INTRAVENOUS | Status: AC
  Administered 2017-02-11: 1000 mL via INTRAVENOUS

## 2017-02-11 NOTE — ED Provider Notes (Signed)
[facility] DEPT Provider Note   CSN: [id number] Arrival date & time: 02/11/17  0911     History   Chief Complaint Chief Complaint  Patient presents with  . Diarrhea    HPI Adriana Hill is a 71 y.o. female.  Patient presents with complaint of diarrhea for 4 days. Diarrhea is large volume, liquid, no blood. She had felt smell at onset. Patient works in a nursing home. No vomiting or fever. No chest pain or shortness of breath. She has some waxing and waning, nonfocal, abdominal cramping. She has been drinking liquids at home. Patient states that she gets diarrhea once a year. No history of inflammatory bowel disease, diverticulitis. Two previous unremarkable colonoscopies. No recent travel. Patient denies heavy NSAID or alcohol use. She is usually constipated and has been taking MiraLAX on occasion. She reports taking small amounts last week prior to symptom onset. The onset of this condition was acute. The course is constant. Aggravating factors: none. Alleviating factors: none.        Past Medical History:  Diagnosis Date  . Allergy    hay fever  . Blood in stool   . Cancer [facility]) 2004   breast    Patient Active Problem List   Diagnosis Date Noted  . Breast cancer ([place]) 04/15/2012    Past Surgical History:  Procedure Laterality Date  . MASTECTOMY  2004   right    OB History    No data available       Home Medications    Prior to Admission medications   Not on File    Family History History reviewed. No pertinent family history.  Social History Social History  Substance Use Topics  . Smoking status: Never Smoker  . Smokeless tobacco: Never Used  . Alcohol use Yes     Allergies   Dicyclomine hcl; Flagyl [metronidazole]; Augmentin [amoxicillin-pot clavulanate]; and Theophyllines   Review of Systems Review of Systems  Constitutional: Negative for fever.  HENT: Negative for rhinorrhea and sore throat.   Eyes: Negative for redness.    Respiratory: Negative for cough.   Cardiovascular: Negative for chest pain.  Gastrointestinal: Positive for abdominal pain and diarrhea. Negative for blood in stool, nausea and vomiting.  Genitourinary: Negative for dysuria.  Musculoskeletal: Negative for myalgias.  Skin: Negative for rash.  Neurological: Negative for headaches.     Physical Exam Updated Vital Signs BP 129/89 (BP Location: Left Arm)   Pulse 113   Temp 98.5 F (36.9 C) (Oral)   Resp 18   Ht 5\' 2"  (1.575 m)   Wt 54.9 kg   SpO2 99%   BMI 22.13 kg/m   Physical Exam  Constitutional: She appears well-developed and well-nourished.  HENT:  Head: Normocephalic and atraumatic.  Mouth/Throat: Mucous membranes are dry.  Eyes: Conjunctivae are normal. Right eye exhibits no discharge. Left eye exhibits no discharge.  Neck: Normal range of motion. Neck supple.  Cardiovascular: Regular rhythm and normal heart sounds.  Tachycardia present.  Exam reveals no friction rub.   No murmur heard. Mild tachycardia  Pulmonary/Chest: Effort normal and breath sounds normal.  Abdominal: Soft. She exhibits no mass. There is no tenderness. There is no guarding.  Neurological: She is alert.  Skin: Skin is warm and dry.  Normal skin turgor.  Psychiatric: She has a normal mood and affect.  Nursing note and vitals reviewed.    ED Treatments / Results  Labs (all labs ordered are listed, but only abnormal results are displayed) Labs  Reviewed  COMPREHENSIVE METABOLIC PANEL - Abnormal; Notable for the following:       Result Value   Glucose, Bld 108 (*)    Calcium 8.6 (*)    GFR calc non Af Amer 59 (*)    All other components within normal limits  URINALYSIS, ROUTINE W REFLEX MICROSCOPIC - Abnormal; Notable for the following:    Color, Urine STRAW (*)    Specific Gravity, Urine 1.003 (*)    Hgb urine dipstick SMALL (*)    Bacteria, UA RARE (*)    Squamous Epithelial / LPF 0-5 (*)    All other components within normal limits   LIPASE, BLOOD  CBC    Procedures Procedures (including critical care time)  Medications Ordered in ED Medications  sodium chloride 0.9 % bolus 1,000 mL (0 mLs Intravenous Stopped 02/11/17 1136)  sodium chloride 0.9 % bolus 500 mL (500 mLs Intravenous New Bag/Given 02/11/17 1155)     Initial Impression / Assessment and Plan / ED Course  I have reviewed the triage vital signs and the nursing notes.  Pertinent labs & imaging results that were available during my care of the patient were reviewed by me and considered in my medical decision making (see chart for details).     Patient seen and examined. Work-up initiated. Medications ordered.   Vital signs reviewed and are as follows: BP 129/89 (BP Location: Left Arm)   Pulse 113   Temp 98.5 F (36.9 C) (Oral)   Resp 18   Ht 5\' 2"  (1.575 m)   Wt 54.9 kg   SpO2 99%   BMI 22.13 kg/m   1:55 PM Patient hydrated 1500cc.   Pt d/w and seen by Dr. Venora Maples.   Pt ready for d/c to home. Encouraged hydration. The patient was urged to return to the Emergency Department immediately with worsening of current symptoms, worsening abdominal pain, persistent vomiting, blood noted in stools, fever, or any other concerns. The patient verbalized understanding.    Final Clinical Impressions(s) / ED Diagnoses   Final diagnoses:  Diarrhea, unspecified type  Dehydration   Patient with several days of watery stool, clinically dehydrated here. She has been given IV fluids with improvement in symptoms. Heart rate improved to 80-90. Electrolytes are normal. Kidney function is normal. Patient is tolerating oral fluids. No abdominal pain. Feel that she can be discharged home with continued treatment and home, PCP follow-up.   New Prescriptions New Prescriptions   No medications on file     Carlisle Cater, PA-C 02/11/17 [place], MD 02/11/17 [phone]

## 2017-02-11 NOTE — ED Triage Notes (Signed)
Per EMS, pt from home.  Pt c/o diarrhea x 4 days.  No abdominal pain.  No n/v or fever.  Vitals hr 120, 136/84, cbg 104

## 2017-02-11 NOTE — ED Notes (Signed)
Bed: WA02 Expected date:  Expected time:  Means of arrival:  Comments: 

## 2017-02-11 NOTE — Discharge Instructions (Signed)
Please read and follow all provided instructions.  Your diagnoses today include:  1. Diarrhea, unspecified type   2. Dehydration     Tests performed today include:  Blood counts and electrolytes - normal  Vital signs. See below for your results today.   Medications prescribed:   None  Take any prescribed medications only as directed.  Home care instructions:  Follow any educational materials contained in this packet.  BE VERY CAREFUL not to take multiple medicines containing Tylenol (also called acetaminophen). Doing so can lead to an overdose which can damage your liver and cause liver failure and possibly death.   Follow-up instructions: Please follow-up with your primary care provider in the next 2-3 days for further evaluation of your symptoms.   Return instructions:   Please return to the Emergency Department if you experience worsening symptoms.   Return With worsening pain, vomiting, blood in the stool.  Please return if you have any other emergent concerns.  Additional Information:  Your vital signs today were: BP 130/77 (BP Location: Left Arm)    Pulse 80    Temp 98.2 F (36.8 C) (Oral)    Resp 18    Ht 5\' 2"  (1.575 m)    Wt 54.9 kg    SpO2 97%    BMI 22.13 kg/m  If your blood pressure (BP) was elevated above 135/85 this visit, please have this repeated by your doctor within one month. --------------

## 2017-02-11 NOTE — ED Notes (Signed)
Bed: [id number] Expected date:  Expected time:  Means of arrival:  Comments: EMS 70yo n/v

## 2017-02-13 ENCOUNTER — Encounter: Payer: Self-pay | Admitting: Family Medicine

## 2017-02-13 ENCOUNTER — Ambulatory Visit (INDEPENDENT_AMBULATORY_CARE_PROVIDER_SITE_OTHER): Payer: BLUE CROSS/BLUE SHIELD | Admitting: Family Medicine

## 2017-02-13 VITALS — BP 110/80 | HR 72 | Temp 98.5°F | Wt 120.5 lb

## 2017-02-13 DIAGNOSIS — R197 Diarrhea, unspecified: Secondary | ICD-10-CM

## 2017-02-13 DIAGNOSIS — K219 Gastro-esophageal reflux disease without esophagitis: Secondary | ICD-10-CM | POA: Diagnosis not present

## 2017-02-13 NOTE — Progress Notes (Signed)
Pre visit review using our clinic review tool, if applicable. No additional management support is needed unless otherwise documented below in the visit note. 

## 2017-02-13 NOTE — Progress Notes (Signed)
Subjective:     Patient ID: Adriana Hill, female   DOB: Apr 18, 1946, 71 y.o.   MRN: [id number]  HPI Patient seen for ER follow-up. She was seen there on March 5. She had had about 4 or 5 days of diarrhea which was watery and severe but nonbloody. She did not have any nausea, vomiting, or fever. She had extreme fatigue and diffuse abdominal cramping. She had 2 previous normal colonoscopies. No recent travels or antibiotic use. She denied any recent laxative use. She works in a nursing home. She is not aware of any significant diarrhea outbreak there. She was tachycardic on admission. She had lab work which was basically unremarkable. She was hydrated with 1500 mL of fluid and felt some better afterwards. Her diarrhea is improving. She now has some loose stools but less frequent. Decreased appetite. No bloody stools. No fever.  Patient also relates 2 to 3 month history of increased GERD symptoms. She has frequent burping and some substernal burning. She's not tried any medications other than anti-gas medications. She frequently drinks hot tea in the evening and also usually about 6 ounces of wine per day. Nonsmoker. No dysphagia.  Past Medical History:  Diagnosis Date  . Allergy    hay fever  . Blood in stool   . Cancer [facility]) 2004   breast   Past Surgical History:  Procedure Laterality Date  . MASTECTOMY  2004   right    reports that she has never smoked. She has never used smokeless tobacco. She reports that she drinks alcohol. Her drug history is not on file. family history is not on file. Allergies  Allergen Reactions  . Dicyclomine Hcl Palpitations    Patient had a reaction of tachycardia palpitations 30 minutes after taking medication  . Flagyl [Metronidazole] Anaphylaxis  . Augmentin [Amoxicillin-Pot Clavulanate] Diarrhea  . Theophyllines Rash     Review of Systems  Constitutional: Positive for appetite change and fatigue. Negative for chills and fever.  Respiratory: Negative for  cough.   Cardiovascular: Negative for chest pain.  Gastrointestinal: Positive for diarrhea. Negative for abdominal distention, blood in stool, nausea and vomiting.  Hematological: Negative for adenopathy.       Objective:   Physical Exam  Constitutional: She appears well-developed and well-nourished.  HENT:  Mouth/Throat: Oropharynx is clear and moist.  Cardiovascular: Normal rate and regular rhythm.   Pulmonary/Chest: Effort normal and breath sounds normal. No respiratory distress. She has no wheezes. She has no rales.  Abdominal: Soft. Bowel sounds are normal. She exhibits no distension and no mass. There is no tenderness. There is no rebound and no guarding.       Assessment:     #1 recent diarrhea. Suspect viral illness. This is gradually improving  #2 GERD with progression of symptoms over the past few months. She has not had any major weight changes or dysphagia    Plan:     -Handout given on appropriate food choices for diarrhea -We recommend she consider over-the-counter Pepcid 20 mg twice a day for GERD symptoms -Gradually reduce caffeine and alcohol intake -Touch base in 2-3 weeks if GERD symptoms not improving with the above -We'll schedule complete physical for sometime after April  Eulas Post MD [facility]

## 2017-02-13 NOTE — Patient Instructions (Signed)
Food Choices for Gastroesophageal Reflux Disease, Adult When you have gastroesophageal reflux disease (GERD), the foods you eat and your eating habits are very important. Choosing the right foods can help ease the discomfort of GERD. Consider working with a diet and nutrition specialist (dietitian) to help you make healthy food choices. What general guidelines should I follow? Eating plan   Choose healthy foods low in fat, such as fruits, vegetables, whole grains, low-fat dairy products, and lean meat, fish, and poultry.  Eat frequent, small meals instead of three large meals each day. Eat your meals slowly, in a relaxed setting. Avoid bending over or lying down until 2-3 hours after eating.  Limit high-fat foods such as fatty meats or fried foods.  Limit your intake of oils, butter, and shortening to less than 8 teaspoons each day.  Avoid the following:  Foods that cause symptoms. These may be different for different people. Keep a food diary to keep track of foods that cause symptoms.  Alcohol.  Drinking large amounts of liquid with meals.  Eating meals during the 2-3 hours before bed.  Cook foods using methods other than frying. This may include baking, grilling, or broiling. Lifestyle    Maintain a healthy weight. Ask your health care provider what weight is healthy for you. If you need to lose weight, work with your health care provider to do so safely.  Exercise for at least 30 minutes on 5 or more days each week, or as told by your health care provider.  Avoid wearing clothes that fit tightly around your waist and chest.  Do not use any products that contain nicotine or tobacco, such as cigarettes and e-cigarettes. If you need help quitting, ask your health care provider.  Sleep with the head of your bed raised. Use a wedge under the mattress or blocks under the bed frame to raise the head of the bed. What foods are not recommended? The items listed may not be a complete  list. Talk with your dietitian about what dietary choices are best for you. Grains  Pastries or quick breads with added fat. French toast. Vegetables  Deep fried vegetables. French fries. Any vegetables prepared with added fat. Any vegetables that cause symptoms. For some people this may include tomatoes and tomato products, chili peppers, onions and garlic, and horseradish. Fruits  Any fruits prepared with added fat. Any fruits that cause symptoms. For some people this may include citrus fruits, such as oranges, grapefruit, pineapple, and lemons. Meats and other protein foods  High-fat meats, such as fatty beef or pork, hot dogs, ribs, ham, sausage, salami and bacon. Fried meat or protein, including fried fish and fried chicken. Nuts and nut butters. Dairy  Whole milk and chocolate milk. Sour cream. Cream. Ice cream. Cream cheese. Milk shakes. Beverages  Coffee and tea, with or without caffeine. Carbonated beverages. Sodas. Energy drinks. Fruit juice made with acidic fruits (such as orange or grapefruit). Tomato juice. Alcoholic drinks. Fats and oils  Butter. Margarine. Shortening. Ghee. Sweets and desserts  Chocolate and cocoa. Donuts. Seasoning and other foods  Pepper. Peppermint and spearmint. Any condiments, herbs, or seasonings that cause symptoms. For some people, this may include curry, hot sauce, or vinegar-based salad dressings. Summary  When you have gastroesophageal reflux disease (GERD), food and lifestyle choices are very important to help ease the discomfort of GERD.  Eat frequent, small meals instead of three large meals each day. Eat your meals slowly, in a relaxed setting. Avoid bending over   or lying down until 2-3 hours after eating.  Limit high-fat foods such as fatty meat or fried foods. This information is not intended to replace advice given to you by your health care provider. Make sure you discuss any questions you have with your health care provider. Document  Released: 11/26/2005 Document Revised: 11/27/2016 Document Reviewed: 11/27/2016 Elsevier Interactive Patient Education  2017 [place] Choices to Help Relieve Diarrhea, Adult When you have diarrhea, the foods you eat and your eating habits are very important. Choosing the right foods and drinks can help:  Relieve diarrhea.  Replace lost fluids and nutrients.  Prevent dehydration. What general guidelines should I follow? Relieving diarrhea   Choose foods with less than 2 g or .07 oz. of fiber per serving.  Limit fats to less than 8 tsp (38 g or 1.34 oz.) a day.  Avoid the following:  Foods and beverages sweetened with high-fructose corn syrup, honey, or sugar alcohols such as xylitol, sorbitol, and mannitol.  Foods that contain a lot of fat or sugar.  Fried, greasy, or spicy foods.  High-fiber grains, breads, and cereals.  Raw fruits and vegetables.  Eat foods that are rich in probiotics. These foods include dairy products such as yogurt and fermented milk products. They help increase healthy bacteria in the stomach and intestines (gastrointestinal tract, or GI tract).  If you have lactose intolerance, avoid dairy products. These may make your diarrhea worse.  Take medicine to help stop diarrhea (antidiarrheal medicine) only as told by your health care provider. Replacing nutrients   Eat small meals or snacks every 3-4 hours.  Eat bland foods, such as white rice, toast, or baked potato, until your diarrhea starts to get better. Gradually reintroduce nutrient-rich foods as tolerated or as told by your health care provider. This includes:  Well-cooked protein foods.  Peeled, seeded, and soft-cooked fruits and vegetables.  Low-fat dairy products.  Take vitamin and mineral supplements as told by your health care provider. Preventing dehydration    Start by sipping water or a special solution to prevent dehydration (oral rehydration solution, ORS). Urine that is  clear or pale yellow means that you are getting enough fluid.  Try to drink at least 8-10 cups of fluid each day to help replace lost fluids.  You may add other liquids in addition to water, such as clear juice or decaffeinated sports drinks, as tolerated or as told by your health care provider.  Avoid drinks with caffeine, such as coffee, tea, or soft drinks.  Avoid alcohol. What foods are recommended? The items listed may not be a complete list. Talk with your health care provider about what dietary choices are best for you. Grains  White rice. White, [place], or pita breads (fresh or toasted), including plain rolls, buns, or bagels. White pasta. Saltine, soda, or graham crackers. Pretzels. Low-fiber cereal. Cooked cereals made with water (such as cornmeal, farina, or cream cereals). Plain muffins. Matzo. Melba toast. Zwieback. Vegetables  Potatoes (without the skin). Most well-cooked and canned vegetables without skins or seeds. Tender lettuce. Fruits  Apple sauce. Fruits canned in juice. Cooked apricots, cherries, grapefruit, peaches, pears, or plums. Fresh bananas and cantaloupe. Meats and other protein foods  Baked or boiled chicken. Eggs. Tofu. Fish. Seafood. Smooth nut butters. Ground or well-cooked tender beef, ham, veal, lamb, pork, or poultry. Dairy  Plain yogurt, kefir, and unsweetened liquid yogurt. Lactose-free milk, buttermilk, skim milk, or soy milk. Low-fat or nonfat hard cheese. Beverages  Water. Low-calorie sports  drinks. Fruit juices without pulp. Strained tomato and vegetable juices. Decaffeinated teas. Sugar-free beverages not sweetened with sugar alcohols. Oral rehydration solutions, if approved by your health care provider. Seasoning and other foods  Bouillon, broth, or soups made from recommended foods. What foods are not recommended? The items listed may not be a complete list. Talk with your health care provider about what dietary choices are best for you. Grains    Whole grain, whole wheat, bran, or rye breads, rolls, pastas, and crackers. Wild or brown rice. Whole grain or bran cereals. Barley. Oats and oatmeal. Corn tortillas or taco shells. Granola. Popcorn. Vegetables  Raw vegetables. Fried vegetables. Cabbage, broccoli, Brussels sprouts, artichokes, baked beans, beet greens, corn, kale, legumes, peas, sweet potatoes, and yams. Potato skins. Cooked spinach and cabbage. Fruits  Dried fruit, including raisins and dates. Raw fruits. Stewed or dried prunes. Canned fruits with syrup. Meat and other protein foods  Fried or fatty meats. Deli meats. Chunky nut butters. Nuts and seeds. Beans and lentils. Berniece Salines. Hot dogs. Sausage. Dairy  High-fat cheeses. Whole milk, chocolate milk, and beverages made with milk, such as milk shakes. Half-and-half. Cream. sour cream. Ice cream. Beverages  Caffeinated beverages (such as coffee, tea, soda, or energy drinks). Alcoholic beverages. Fruit juices with pulp. Prune juice. Soft drinks sweetened with high-fructose corn syrup or sugar alcohols. High-calorie sports drinks. Fats and oils  Butter. Cream sauces. Margarine. Salad oils. Plain salad dressings. Olives. Avocados. Mayonnaise. Sweets and desserts  Sweet rolls, doughnuts, and sweet breads. Sugar-free desserts sweetened with sugar alcohols such as xylitol and sorbitol. Seasoning and other foods  Honey. Hot sauce. Chili powder. Gravy. Cream-based or milk-based soups. Pancakes and waffles. Summary  When you have diarrhea, the foods you eat and your eating habits are very important.  Make sure you get at least 8-10 cups of fluid each day, or enough to keep your urine clear or pale yellow.  Eat bland foods and gradually reintroduce healthy, nutrient-rich foods as tolerated, or as told by your health care provider.  Avoid high-fiber, fried, greasy, or spicy foods. This information is not intended to replace advice given to you by your health care provider. Make sure  you discuss any questions you have with your health care provider. Document Released: 02/16/2004 Document Revised: 11/23/2016 Document Reviewed: 11/23/2016 Elsevier Interactive Patient Education  2017 [place].  Consider OTC Pepcid 20 mg twice daily.

## 2017-02-19 ENCOUNTER — Other Ambulatory Visit: Payer: Self-pay | Admitting: Oncology

## 2017-02-19 DIAGNOSIS — Z1231 Encounter for screening mammogram for malignant neoplasm of breast: Secondary | ICD-10-CM

## 2017-02-19 DIAGNOSIS — Z9011 Acquired absence of right breast and nipple: Secondary | ICD-10-CM

## 2017-04-01 ENCOUNTER — Ambulatory Visit
Admission: RE | Admit: 2017-04-01 | Discharge: 2017-04-01 | Disposition: A | Discharge status: Home / Self Care | Payer: BLUE CROSS/BLUE SHIELD | Source: Ambulatory Visit | Attending: Oncology | Admitting: Oncology

## 2017-04-01 DIAGNOSIS — Z1231 Encounter for screening mammogram for malignant neoplasm of breast: Secondary | ICD-10-CM

## 2017-04-01 DIAGNOSIS — Z9011 Acquired absence of right breast and nipple: Secondary | ICD-10-CM

## 2017-09-11 ENCOUNTER — Encounter: Payer: Self-pay | Admitting: Family Medicine

## 2017-09-11 ENCOUNTER — Ambulatory Visit (INDEPENDENT_AMBULATORY_CARE_PROVIDER_SITE_OTHER): Payer: BLUE CROSS/BLUE SHIELD | Admitting: Family Medicine

## 2017-09-11 VITALS — BP 110/70 | HR 86 | Temp 98.7°F | Ht 62.25 in | Wt 119.0 lb

## 2017-09-11 DIAGNOSIS — Z23 Encounter for immunization: Secondary | ICD-10-CM | POA: Diagnosis not present

## 2017-09-11 DIAGNOSIS — Z0000 Encounter for general adult medical examination without abnormal findings: Secondary | ICD-10-CM | POA: Diagnosis not present

## 2017-09-11 LAB — LIPID PANEL
CHOLESTEROL: 231 mg/dL — AB (ref 0–200)
HDL: 58.4 mg/dL (ref >39.00)
LDL Cholesterol: 153 mg/dL — ABNORMAL HIGH (ref 0–99)
NonHDL: 172.19
Total CHOL/HDL Ratio: 4
Triglycerides: 96 mg/dL (ref 0.0–149.0)
VLDL: 19.2 mg/dL (ref 0.0–40.0)

## 2017-09-11 LAB — CBC WITH DIFFERENTIAL/PLATELET
BASOS PCT: 0.9 % (ref 0.0–3.0)
Basophils Absolute: 0.1 10*3/uL (ref 0.0–0.1)
EOS PCT: 4.3 % (ref 0.0–5.0)
Eosinophils Absolute: 0.3 10*3/uL (ref 0.0–0.7)
HEMATOCRIT: 39.8 % (ref 36.0–46.0)
HEMOGLOBIN: 13.1 g/dL (ref 12.0–15.0)
LYMPHS PCT: 35.3 % (ref 12.0–46.0)
Lymphs Abs: 2.3 10*3/uL (ref 0.7–4.0)
MCHC: 33 g/dL (ref 30.0–36.0)
MCV: 96.5 fl (ref 78.0–100.0)
Monocytes Absolute: 0.7 10*3/uL (ref 0.1–1.0)
Monocytes Relative: 10.7 % (ref 3.0–12.0)
Neutro Abs: 3.2 10*3/uL (ref 1.4–7.7)
Neutrophils Relative %: 48.8 % (ref 43.0–77.0)
Platelets: 309 10*3/uL (ref 150.0–400.0)
RBC: 4.12 Mil/uL (ref 3.87–5.11)
RDW: 13.5 % (ref 11.5–15.5)
WBC: 6.6 10*3/uL (ref 4.0–10.5)

## 2017-09-11 LAB — TSH: TSH: 3.49 u[IU]/mL (ref 0.35–4.50)

## 2017-09-11 LAB — BASIC METABOLIC PANEL
BUN: 13 mg/dL (ref 6–23)
CO2: 31 mEq/L (ref 19–32)
CREATININE: 0.85 mg/dL (ref 0.40–1.20)
Calcium: 9.1 mg/dL (ref 8.4–10.5)
Chloride: 101 mEq/L (ref 96–112)
GFR: 70.4 mL/min (ref >60.00)
Glucose, Bld: 95 mg/dL (ref 70–99)
POTASSIUM: 4.3 meq/L (ref 3.5–5.1)
Sodium: 139 mEq/L (ref 135–145)

## 2017-09-11 LAB — HEPATIC FUNCTION PANEL
ALBUMIN: 4.1 g/dL (ref 3.5–5.2)
ALT: 15 U/L (ref 0–35)
AST: 20 U/L (ref 0–37)
Alkaline Phosphatase: 64 U/L (ref 39–117)
Bilirubin, Direct: 0.1 mg/dL (ref 0.0–0.3)
Total Bilirubin: 0.6 mg/dL (ref 0.2–1.2)
Total Protein: 7.3 g/dL (ref 6.0–8.3)

## 2017-09-11 NOTE — Progress Notes (Signed)
Subjective:     Patient ID: Adriana Hill, female   DOB: 01-Sep-1946, 71 y.o.   MRN: [id number]  HPI Patient seen for physical exam. She had breast cancer around 2004 and has been released from oncology care. She gets mammograms yearly. She saw gynecologist in August and had Pap smear then. She will be due for repeat colonoscopy next year. No history of pneumonia vaccination. She gets flu vaccine through work. No history of shingles vaccine or hepatitis C screening. She is low risk. Takes no regular medications. Nonsmoker. Walks for exercise. She states she cannot take any osteoporosis medications and has declined DEXA screening in the past because of that  Past Medical History:  Diagnosis Date  . Allergy    hay fever  . Blood in stool   . Cancer [facility]) 2004   breast   Past Surgical History:  Procedure Laterality Date  . MASTECTOMY Right 2004   right    reports that she has never smoked. She has never used smokeless tobacco. She reports that she drinks alcohol. Her drug history is not on file. family history is not on file. Allergies  Allergen Reactions  . Dicyclomine Hcl Palpitations    Patient had a reaction of tachycardia palpitations 30 minutes after taking medication  . Flagyl [Metronidazole] Anaphylaxis  . Augmentin [Amoxicillin-Pot Clavulanate] Diarrhea  . Theophyllines Rash     Review of Systems  Constitutional: Negative for activity change, appetite change, fatigue, fever and unexpected weight change.  HENT: Negative for ear pain, hearing loss, sore throat and trouble swallowing.   Eyes: Negative for visual disturbance.  Respiratory: Negative for cough and shortness of breath.   Cardiovascular: Negative for chest pain and palpitations.  Gastrointestinal: Negative for abdominal pain, blood in stool, constipation and diarrhea.  Genitourinary: Negative for dysuria and hematuria.  Musculoskeletal: Negative for arthralgias, back pain and myalgias.  Skin: Negative for rash.   Neurological: Negative for dizziness, syncope and headaches.  Hematological: Negative for adenopathy.  Psychiatric/Behavioral: Negative for confusion and dysphoric mood.       Objective:   Physical Exam  Constitutional: She is oriented to person, place, and time. She appears well-developed and well-nourished.  HENT:  Head: Normocephalic and atraumatic.  Eyes: Pupils are equal, round, and reactive to light. EOM are normal.  Neck: Normal range of motion. Neck supple. No thyromegaly present.  Cardiovascular: Normal rate, regular rhythm and normal heart sounds.   No murmur heard. Pulmonary/Chest: Breath sounds normal. No respiratory distress. She has no wheezes. She has no rales.  Abdominal: Soft. Bowel sounds are normal. She exhibits no distension and no mass. There is no tenderness. There is no rebound and no guarding.  Genitourinary:  Genitourinary Comments: Per gyn   Musculoskeletal: Normal range of motion. She exhibits no edema.  Lymphadenopathy:    She has no cervical adenopathy.  Neurological: She is alert and oriented to person, place, and time. She displays normal reflexes. No cranial nerve deficit.  Skin: No rash noted.  Psychiatric: She has a normal mood and affect. Her behavior is normal. Judgment and thought content normal.       Assessment:     Physical exam. Several health maintenance issues addressed as below    Plan:     -Flu vaccine through her work -Dietitian 13 given and recommended Pneumovax next year -Obtain screening labs including hepatitis C antibody -Continue with yearly mammogram -Repeat colonoscopy by next year -Discussed new shingles vaccine and she wishes to wait until later to  get that. She had concerns about getting two vaccines on the same day -Continue with regular weightbearing exercise and daily calcium and vitamin D  Eulas Post MD [facility] at [facility]

## 2017-09-11 NOTE — Patient Instructions (Signed)
Consider Shingrix vaccine later this year. Continue with regular weight bearing exercise.

## 2017-09-12 LAB — HEPATITIS C ANTIBODY
Hepatitis C Ab: NONREACTIVE
SIGNAL TO CUT-OFF: 0.23 (ref <1.00)

## 2017-12-25 ENCOUNTER — Encounter: Payer: Self-pay | Admitting: Family Medicine

## 2017-12-25 ENCOUNTER — Ambulatory Visit: Payer: BLUE CROSS/BLUE SHIELD | Admitting: Family Medicine

## 2017-12-25 VITALS — BP 120/74 | HR 90 | Temp 99.0°F | Wt 122.9 lb

## 2017-12-25 DIAGNOSIS — R600 Localized edema: Secondary | ICD-10-CM

## 2017-12-25 NOTE — Progress Notes (Signed)
Subjective:     Patient ID: Adriana Hill, female   DOB: 1945/12/20, 72 y.o.   MRN: [id number]  HPI Patient is seen with concern for mild right leg edema for approximately one and one half weeks. Her edema seems to be worse late in the day. She denies any pain but states she has somewhat of a "pulling" sensation in her upper calf region posteriorly. No history of DVT. No dyspnea. No chest pain. No recent travels. Nonsmoker.  She is adopted so biologic family history unknown. Patient does have remote history of breast cancer but this was back in 2004. No pain with ambulation. No hormonal therapy or any other specific risk factors for DVT  Past Medical History:  Diagnosis Date  . Allergy    hay fever  . Blood in stool   . Cancer [facility]) 2004   breast   Past Surgical History:  Procedure Laterality Date  . MASTECTOMY Right 2004   right    reports that  has never smoked. she has never used smokeless tobacco. She reports that she drinks alcohol. Her drug history is not on file. family history is not on file. Allergies  Allergen Reactions  . Dicyclomine Hcl Palpitations    Patient had a reaction of tachycardia palpitations 30 minutes after taking medication  . Flagyl [Metronidazole] Anaphylaxis  . Augmentin [Amoxicillin-Pot Clavulanate] Diarrhea  . Theophyllines Rash     Review of Systems  Constitutional: Negative for appetite change and fever.  Respiratory: Negative for cough and shortness of breath.   Cardiovascular: Negative for chest pain and palpitations.  Neurological: Negative for weakness and numbness.       Objective:   Physical Exam  Constitutional: She appears well-developed and well-nourished.  Neck: Neck supple.  Cardiovascular: Normal rate and regular rhythm.  Pulmonary/Chest: Effort normal and breath sounds normal. No respiratory distress. She has no wheezes. She has no rales.  Musculoskeletal: She exhibits no edema.  No calf tenderness. No thigh tenderness. No  visible or pitting edema. We also measured from the medial malleolus 21 cm up on the calf right and left leg and obtained circumference of 32 cm at that level bilaterally. No pitting edema. No color changes to the skin. Distal pulses normal       Assessment:     Subjective complaints of right leg edema. She does not have any worrisome symptoms such as chest pain or dyspnea Fairly low clinical suspicion of DVT.      Plan:     -Check d-dimer. If negative reassurance and observation. If positive proceed with venous Dopplers right lower extremity -Patient advised to follow-up immediately for any dyspnea or chest pain or any progressive swelling  Eulas Post MD [facility] at [facility]

## 2017-12-25 NOTE — Patient Instructions (Signed)
Follow up immediately for any chest pain or sudden shortness of breath.

## 2017-12-26 ENCOUNTER — Other Ambulatory Visit: Payer: Self-pay | Admitting: *Deleted

## 2017-12-26 DIAGNOSIS — R600 Localized edema: Secondary | ICD-10-CM

## 2017-12-26 LAB — D-DIMER, QUANTITATIVE: D-Dimer, Quant: 1.97 mcg/mL FEU — ABNORMAL HIGH (ref <0.50)

## 2017-12-27 ENCOUNTER — Ambulatory Visit (HOSPITAL_COMMUNITY)
Admission: RE | Admit: 2017-12-27 | Discharge: 2017-12-27 | Disposition: A | Discharge status: Home / Self Care | Payer: BLUE CROSS/BLUE SHIELD | Source: Ambulatory Visit | Attending: Cardiology | Admitting: Cardiology

## 2017-12-27 DIAGNOSIS — R600 Localized edema: Secondary | ICD-10-CM | POA: Diagnosis present

## 2017-12-27 NOTE — Addendum Note (Signed)
Addended by: Dorrene German on: 12/27/2017 08:56 AM   Modules accepted: Orders

## 2017-12-27 NOTE — Progress Notes (Signed)
US scheduled for today.

## 2018-03-24 ENCOUNTER — Other Ambulatory Visit: Payer: Self-pay | Admitting: Oncology

## 2018-03-24 DIAGNOSIS — Z1231 Encounter for screening mammogram for malignant neoplasm of breast: Secondary | ICD-10-CM

## 2018-04-16 ENCOUNTER — Ambulatory Visit
Admission: RE | Admit: 2018-04-16 | Discharge: 2018-04-16 | Disposition: A | Discharge status: Home / Self Care | Payer: BLUE CROSS/BLUE SHIELD | Source: Ambulatory Visit | Attending: Oncology | Admitting: Oncology

## 2018-04-16 DIAGNOSIS — Z1231 Encounter for screening mammogram for malignant neoplasm of breast: Secondary | ICD-10-CM

## 2018-04-16 HISTORY — DX: Personal history of antineoplastic chemotherapy: Z92.21

## 2018-04-16 HISTORY — DX: Personal history of irradiation: Z92.3

## 2018-07-28 DIAGNOSIS — Z01419 Encounter for gynecological examination (general) (routine) without abnormal findings: Secondary | ICD-10-CM | POA: Diagnosis not present

## 2018-08-21 ENCOUNTER — Emergency Department: Admission: EM | Admit: 2018-08-21 | Payer: BLUE CROSS/BLUE SHIELD | Source: Home / Self Care

## 2018-09-03 ENCOUNTER — Encounter: Payer: Self-pay | Admitting: Family Medicine

## 2018-09-03 ENCOUNTER — Ambulatory Visit (INDEPENDENT_AMBULATORY_CARE_PROVIDER_SITE_OTHER): Payer: Medicare HMO | Admitting: Family Medicine

## 2018-09-03 ENCOUNTER — Other Ambulatory Visit: Payer: Self-pay

## 2018-09-03 VITALS — BP 108/64 | HR 90 | Temp 98.4°F | Ht 62.25 in | Wt 118.7 lb

## 2018-09-03 DIAGNOSIS — D485 Neoplasm of uncertain behavior of skin: Secondary | ICD-10-CM | POA: Diagnosis not present

## 2018-09-03 DIAGNOSIS — D2261 Melanocytic nevi of right upper limb, including shoulder: Secondary | ICD-10-CM | POA: Diagnosis not present

## 2018-09-03 DIAGNOSIS — D2262 Melanocytic nevi of left upper limb, including shoulder: Secondary | ICD-10-CM | POA: Diagnosis not present

## 2018-09-03 DIAGNOSIS — J069 Acute upper respiratory infection, unspecified: Secondary | ICD-10-CM

## 2018-09-03 DIAGNOSIS — L570 Actinic keratosis: Secondary | ICD-10-CM | POA: Diagnosis not present

## 2018-09-03 DIAGNOSIS — L821 Other seborrheic keratosis: Secondary | ICD-10-CM | POA: Diagnosis not present

## 2018-09-03 NOTE — Patient Instructions (Signed)
Let me know by next week if not improving  Stay well hydrated  Continue with topical heat.

## 2018-09-03 NOTE — Progress Notes (Signed)
  Subjective:     Patient ID: Adriana Hill, female   DOB: [birth year], 72 y.o.   MRN: [id number]  HPI Patient seen for acute visit.  Onset 9 days ago of URI symptoms.  She had some initial sore throat and also describe low-grade fever for couple days.  None since then.  She has had some sinus pressure and congestion mostly maxillary sinuses.  Only occasional cough.  Denies any dyspnea.  She was concerned she may be developing sinusitis.  No upper teeth pain.  Occasional early morning yellow mucus.  No bloody discharge.  Past Medical History:  Diagnosis Date  . Allergy    hay fever  . Blood in stool   . Cancer [facility]) 2004   breast  . Personal history of chemotherapy   . Personal history of radiation therapy    Past Surgical History:  Procedure Laterality Date  . MASTECTOMY Right 2004   right    reports that she has never smoked. She has never used smokeless tobacco. She reports that she drinks alcohol. Her drug history is not on file. family history is not on file. She was adopted. Allergies  Allergen Reactions  . Dicyclomine Hcl Palpitations    Patient had a reaction of tachycardia palpitations 30 minutes after taking medication  . Flagyl [Metronidazole] Anaphylaxis  . Augmentin [Amoxicillin-Pot Clavulanate] Diarrhea  . Theophyllines Rash     Review of Systems  Constitutional: Negative for fever.  HENT: Positive for congestion, sinus pressure and sinus pain.   Respiratory: Positive for cough. Negative for shortness of breath and wheezing.        Objective:   Physical Exam  Constitutional: She appears well-developed and well-nourished.  HENT:  Right Ear: External ear normal.  Left Ear: External ear normal.  Mouth/Throat: Oropharynx is clear and moist.  Neck: Neck supple.  Cardiovascular: Normal rate and regular rhythm.  Pulmonary/Chest: Effort normal and breath sounds normal.  Lymphadenopathy:    She has no cervical adenopathy.       Assessment:     URI.  Suspect  viral    Plan:     -We recommend giving this another few days of observation.  Follow-up promptly for any fever and also touch base by next week if not continuing to improve.  She will continue conservative measures with topical heat, hot showers, saline nasal irrigation  Eulas Post MD [facility]

## 2018-09-16 ENCOUNTER — Encounter: Payer: Self-pay | Admitting: Family Medicine

## 2018-09-16 ENCOUNTER — Ambulatory Visit (INDEPENDENT_AMBULATORY_CARE_PROVIDER_SITE_OTHER): Payer: Medicare HMO | Admitting: Family Medicine

## 2018-09-16 ENCOUNTER — Other Ambulatory Visit: Payer: Self-pay

## 2018-09-16 VITALS — BP 102/64 | HR 57 | Temp 98.6°F | Ht 62.25 in | Wt 114.3 lb

## 2018-09-16 DIAGNOSIS — Z0000 Encounter for general adult medical examination without abnormal findings: Secondary | ICD-10-CM | POA: Diagnosis not present

## 2018-09-16 DIAGNOSIS — Z23 Encounter for immunization: Secondary | ICD-10-CM

## 2018-09-16 LAB — CBC WITH DIFFERENTIAL/PLATELET
Basophils Absolute: 0.1 10*3/uL (ref 0.0–0.1)
Basophils Relative: 0.6 % (ref 0.0–3.0)
EOS PCT: 3 % (ref 0.0–5.0)
Eosinophils Absolute: 0.2 10*3/uL (ref 0.0–0.7)
HCT: 39.1 % (ref 36.0–46.0)
HEMOGLOBIN: 12.8 g/dL (ref 12.0–15.0)
LYMPHS PCT: 27.5 % (ref 12.0–46.0)
Lymphs Abs: 2.1 10*3/uL (ref 0.7–4.0)
MCHC: 32.8 g/dL (ref 30.0–36.0)
MCV: 95.7 fl (ref 78.0–100.0)
MONO ABS: 0.8 10*3/uL (ref 0.1–1.0)
MONOS PCT: 10.3 % (ref 3.0–12.0)
Neutro Abs: 4.6 10*3/uL (ref 1.4–7.7)
Neutrophils Relative %: 58.6 % (ref 43.0–77.0)
Platelets: 299 10*3/uL (ref 150.0–400.0)
RBC: 4.08 Mil/uL (ref 3.87–5.11)
RDW: 12.9 % (ref 11.5–15.5)
WBC: 7.8 10*3/uL (ref 4.0–10.5)

## 2018-09-16 LAB — HEPATIC FUNCTION PANEL
ALBUMIN: 4 g/dL (ref 3.5–5.2)
ALK PHOS: 57 U/L (ref 39–117)
ALT: 11 U/L (ref 0–35)
AST: 16 U/L (ref 0–37)
Bilirubin, Direct: 0.1 mg/dL (ref 0.0–0.3)
Total Bilirubin: 0.5 mg/dL (ref 0.2–1.2)
Total Protein: 7.2 g/dL (ref 6.0–8.3)

## 2018-09-16 LAB — BASIC METABOLIC PANEL
BUN: 18 mg/dL (ref 6–23)
CALCIUM: 9.3 mg/dL (ref 8.4–10.5)
CO2: 29 mEq/L (ref 19–32)
CREATININE: 0.92 mg/dL (ref 0.40–1.20)
Chloride: 101 mEq/L (ref 96–112)
GFR: 64.06 mL/min (ref >60.00)
GLUCOSE: 81 mg/dL (ref 70–99)
Potassium: 4.3 mEq/L (ref 3.5–5.1)
Sodium: 137 mEq/L (ref 135–145)

## 2018-09-16 LAB — LIPID PANEL
Cholesterol: 222 mg/dL — ABNORMAL HIGH (ref 0–200)
HDL: 54.2 mg/dL (ref >39.00)
LDL Cholesterol: 141 mg/dL — ABNORMAL HIGH (ref 0–99)
NONHDL: 167.66
Total CHOL/HDL Ratio: 4
Triglycerides: 133 mg/dL (ref 0.0–149.0)
VLDL: 26.6 mg/dL (ref 0.0–40.0)

## 2018-09-16 LAB — TSH: TSH: 2.32 u[IU]/mL (ref 0.35–4.50)

## 2018-09-16 NOTE — Patient Instructions (Signed)
Recommend come back for pneumovax (nurse visit) at your convenience  Set up repeat colonoscopy  Consider trial of OTC Flonase or Nasacort .

## 2018-09-16 NOTE — Progress Notes (Signed)
Subjective:     Patient ID: Adriana Hill, female   DOB: [birth year], 72 y.o.   MRN: [id number]  HPI Patient seen for physical exam.  She has history of breast cancer and is followed still by oncology.  Currently takes no regular medications.  Health maintenance reviewed:  -Hepatitis C screen last year negative -Last DEXA 2014 and patient declines further DEXA scans -Needs Pneumovax.  She had Prevnar 13 last year -Needs flu vaccine -Needs follow-up colonoscopy  Family history unknown as patient is adopted  Past Medical History:  Diagnosis Date  . Allergy    hay fever  . Blood in stool   . Cancer [facility]) 2004   breast  . Personal history of chemotherapy   . Personal history of radiation therapy    Past Surgical History:  Procedure Laterality Date  . MASTECTOMY Right 2004   right    reports that she has never smoked. She has never used smokeless tobacco. She reports that she drinks alcohol. Her drug history is not on file. family history is not on file. She was adopted. Allergies  Allergen Reactions  . Dicyclomine Hcl Palpitations    Patient had a reaction of tachycardia palpitations 30 minutes after taking medication  . Flagyl [Metronidazole] Anaphylaxis  . Augmentin [Amoxicillin-Pot Clavulanate] Diarrhea  . Theophyllines Rash     Review of Systems  Constitutional: Negative for activity change, appetite change, fatigue, fever and unexpected weight change.  HENT: Positive for congestion and postnasal drip. Negative for ear pain, hearing loss, sore throat and trouble swallowing.   Eyes: Negative for visual disturbance.  Respiratory: Negative for cough and shortness of breath.   Cardiovascular: Negative for chest pain and palpitations.  Gastrointestinal: Negative for abdominal pain, blood in stool, constipation and diarrhea.  Genitourinary: Negative for dysuria and hematuria.  Musculoskeletal: Negative for arthralgias, back pain and myalgias.  Skin: Negative for rash.   Neurological: Negative for dizziness, syncope and headaches.  Hematological: Negative for adenopathy.  Psychiatric/Behavioral: Negative for confusion and dysphoric mood.       Objective:   Physical Exam  Constitutional: She is oriented to person, place, and time. She appears well-developed and well-nourished.  HENT:  Head: Normocephalic and atraumatic.  Eyes: Pupils are equal, round, and reactive to light. EOM are normal.  Neck: Normal range of motion. Neck supple. No thyromegaly present.  Cardiovascular: Normal rate, regular rhythm and normal heart sounds.  No murmur heard. Pulmonary/Chest: Breath sounds normal. No respiratory distress. She has no wheezes. She has no rales.  Abdominal: Soft. Bowel sounds are normal. She exhibits no distension and no mass. There is no tenderness. There is no rebound and no guarding.  Musculoskeletal: Normal range of motion. She exhibits no edema.  Lymphadenopathy:    She has no cervical adenopathy.  Neurological: She is alert and oriented to person, place, and time. She displays normal reflexes. No cranial nerve deficit.  Skin: No rash noted.  Psychiatric: She has a normal mood and affect. Her behavior is normal. Judgment and thought content normal.       Assessment:     Physical exam.  Patient has history of right breast cancer with previous mastectomy.  Several health maintenance issues addressed as below    Plan:     -Flu vaccine given -Patient needs Pneumovax and she prefers to come back later time on a separate day for that -She will set up repeat colonoscopy -She declines further DEXA scanning because of her prior radiation therapy and she  declines any other potential treatments for osteoporosis. -Try over-the-counter Flonase or Nasacort for allergic postnasal drip symptoms -Screening labs obtained  Eulas Post MD [facility] at [facility]

## 2018-09-30 ENCOUNTER — Ambulatory Visit (INDEPENDENT_AMBULATORY_CARE_PROVIDER_SITE_OTHER): Payer: Medicare HMO | Admitting: *Deleted

## 2018-09-30 DIAGNOSIS — Z23 Encounter for immunization: Secondary | ICD-10-CM

## 2019-03-28 DIAGNOSIS — C50911 Malignant neoplasm of unspecified site of right female breast: Secondary | ICD-10-CM | POA: Diagnosis not present

## 2019-05-08 ENCOUNTER — Other Ambulatory Visit: Payer: Self-pay | Admitting: Obstetrics and Gynecology

## 2019-05-08 DIAGNOSIS — Z1231 Encounter for screening mammogram for malignant neoplasm of breast: Secondary | ICD-10-CM

## 2019-06-25 ENCOUNTER — Other Ambulatory Visit: Payer: Self-pay

## 2019-06-25 ENCOUNTER — Ambulatory Visit
Admission: RE | Admit: 2019-06-25 | Discharge: 2019-06-25 | Disposition: A | Discharge status: Home / Self Care | Payer: Medicare HMO | Source: Ambulatory Visit | Attending: Obstetrics and Gynecology | Admitting: Obstetrics and Gynecology

## 2019-06-25 DIAGNOSIS — Z1231 Encounter for screening mammogram for malignant neoplasm of breast: Secondary | ICD-10-CM | POA: Diagnosis not present

## 2019-07-30 DIAGNOSIS — Z01419 Encounter for gynecological examination (general) (routine) without abnormal findings: Secondary | ICD-10-CM | POA: Diagnosis not present

## 2019-09-07 DIAGNOSIS — L820 Inflamed seborrheic keratosis: Secondary | ICD-10-CM | POA: Diagnosis not present

## 2019-09-07 DIAGNOSIS — D485 Neoplasm of uncertain behavior of skin: Secondary | ICD-10-CM | POA: Diagnosis not present

## 2019-09-07 DIAGNOSIS — L821 Other seborrheic keratosis: Secondary | ICD-10-CM | POA: Diagnosis not present

## 2019-09-07 DIAGNOSIS — L814 Other melanin hyperpigmentation: Secondary | ICD-10-CM | POA: Diagnosis not present

## 2019-09-07 DIAGNOSIS — L565 Disseminated superficial actinic porokeratosis (DSAP): Secondary | ICD-10-CM | POA: Diagnosis not present

## 2019-09-21 ENCOUNTER — Encounter: Payer: Self-pay | Admitting: Family Medicine

## 2019-09-21 ENCOUNTER — Other Ambulatory Visit: Payer: Self-pay

## 2019-09-21 ENCOUNTER — Ambulatory Visit (INDEPENDENT_AMBULATORY_CARE_PROVIDER_SITE_OTHER): Payer: Medicare HMO | Admitting: Family Medicine

## 2019-09-21 VITALS — BP 118/68 | HR 95 | Temp 97.3°F | Resp 16 | Ht 62.0 in | Wt 121.2 lb

## 2019-09-21 DIAGNOSIS — Z0000 Encounter for general adult medical examination without abnormal findings: Secondary | ICD-10-CM | POA: Diagnosis not present

## 2019-09-21 DIAGNOSIS — M810 Age-related osteoporosis without current pathological fracture: Secondary | ICD-10-CM

## 2019-09-21 DIAGNOSIS — Z23 Encounter for immunization: Secondary | ICD-10-CM | POA: Diagnosis not present

## 2019-09-21 DIAGNOSIS — Z853 Personal history of malignant neoplasm of breast: Secondary | ICD-10-CM | POA: Diagnosis not present

## 2019-09-21 DIAGNOSIS — E785 Hyperlipidemia, unspecified: Secondary | ICD-10-CM

## 2019-09-21 LAB — COMPREHENSIVE METABOLIC PANEL
ALT: 13 U/L (ref 0–35)
AST: 20 U/L (ref 0–37)
Albumin: 3.9 g/dL (ref 3.5–5.2)
Alkaline Phosphatase: 62 U/L (ref 39–117)
BUN: 12 mg/dL (ref 6–23)
CO2: 30 mEq/L (ref 19–32)
Calcium: 9 mg/dL (ref 8.4–10.5)
Chloride: 103 mEq/L (ref 96–112)
Creatinine, Ser: 0.87 mg/dL (ref 0.40–1.20)
GFR: 64.1 mL/min (ref >60.00)
Glucose, Bld: 76 mg/dL (ref 70–99)
Potassium: 3.9 mEq/L (ref 3.5–5.1)
Sodium: 139 mEq/L (ref 135–145)
Total Bilirubin: 0.4 mg/dL (ref 0.2–1.2)
Total Protein: 6.9 g/dL (ref 6.0–8.3)

## 2019-09-21 LAB — LIPID PANEL
Cholesterol: 221 mg/dL — ABNORMAL HIGH (ref 0–200)
HDL: 52 mg/dL (ref >39.00)
LDL Cholesterol: 144 mg/dL — ABNORMAL HIGH (ref 0–99)
NonHDL: 168.72
Total CHOL/HDL Ratio: 4
Triglycerides: 125 mg/dL (ref 0.0–149.0)
VLDL: 25 mg/dL (ref 0.0–40.0)

## 2019-09-21 LAB — CBC WITH DIFFERENTIAL/PLATELET
Basophils Absolute: 0.1 10*3/uL (ref 0.0–0.1)
Basophils Relative: 0.9 % (ref 0.0–3.0)
Eosinophils Absolute: 0.2 10*3/uL (ref 0.0–0.7)
Eosinophils Relative: 3.6 % (ref 0.0–5.0)
HCT: 38 % (ref 36.0–46.0)
Hemoglobin: 12.4 g/dL (ref 12.0–15.0)
Lymphocytes Relative: 31.7 % (ref 12.0–46.0)
Lymphs Abs: 1.9 10*3/uL (ref 0.7–4.0)
MCHC: 32.6 g/dL (ref 30.0–36.0)
MCV: 95.6 fl (ref 78.0–100.0)
Monocytes Absolute: 0.7 10*3/uL (ref 0.1–1.0)
Monocytes Relative: 11.4 % (ref 3.0–12.0)
Neutro Abs: 3.2 10*3/uL (ref 1.4–7.7)
Neutrophils Relative %: 52.4 % (ref 43.0–77.0)
Platelets: 288 10*3/uL (ref 150.0–400.0)
RBC: 3.98 Mil/uL (ref 3.87–5.11)
RDW: 13 % (ref 11.5–15.5)
WBC: 6.1 10*3/uL (ref 4.0–10.5)

## 2019-09-21 LAB — VITAMIN D 25 HYDROXY (VIT D DEFICIENCY, FRACTURES): VITD: 24.92 ng/mL — ABNORMAL LOW (ref 30.00–100.00)

## 2019-09-21 NOTE — Progress Notes (Signed)
Subjective:     Patient ID: Adriana Hill, female   DOB: 12/14/1945, 73 y.o.   MRN: [id number]  HPI [place] is here for physical exam.  She still sees gynecologist yearly.  She does have history of known osteoporosis but she declines osteoporosis therapies because of prior history of radiation and concerns with other sight fractures.  She is due for repeat colonoscopy but has been delayed because of pandemic.  She is considering rescheduling.  Her last tetanus was over 10 years ago.  She needs flu vaccine.  Pneumonia vaccines up-to-date.  She had breast cancer back in 2004 and has done well since then.  She gets yearly mammograms.  She has not taken consistent vitamin D.  She walks some for exercise.  She is adopted so family history is unknown  Past Medical History:  Diagnosis Date  . Allergy    hay fever  . Blood in stool   . Cancer [facility]) 2004   breast  . Personal history of chemotherapy   . Personal history of radiation therapy    Past Surgical History:  Procedure Laterality Date  . MASTECTOMY Right 2004   right    reports that she has never smoked. She has never used smokeless tobacco. She reports current alcohol use. No history on file for drug. family history is not on file. She was adopted. Allergies  Allergen Reactions  . Dicyclomine Hcl Palpitations    Patient had a reaction of tachycardia palpitations 30 minutes after taking medication  . Flagyl [Metronidazole] Anaphylaxis  . Augmentin [Amoxicillin-Pot Clavulanate] Diarrhea  . Theophyllines Rash     Review of Systems  Constitutional: Negative for activity change, appetite change, fatigue, fever and unexpected weight change.  HENT: Negative for ear pain, hearing loss, sore throat and trouble swallowing.   Eyes: Negative for visual disturbance.  Respiratory: Negative for cough and shortness of breath.   Cardiovascular: Negative for chest pain and palpitations.  Gastrointestinal: Negative for abdominal pain, blood in  stool, constipation and diarrhea.  Genitourinary: Negative for dysuria and hematuria.  Musculoskeletal: Negative for arthralgias, back pain and myalgias.  Skin: Negative for rash.  Neurological: Negative for dizziness, syncope and headaches.  Hematological: Negative for adenopathy.  Psychiatric/Behavioral: Negative for confusion and dysphoric mood.       Objective:   Physical Exam Constitutional:      Appearance: She is well-developed.  HENT:     Head: Normocephalic and atraumatic.  Eyes:     Pupils: Pupils are equal, round, and reactive to light.  Neck:     Musculoskeletal: Normal range of motion and neck supple.     Thyroid: No thyromegaly.  Cardiovascular:     Rate and Rhythm: Normal rate and regular rhythm.     Heart sounds: Normal heart sounds. No murmur.  Pulmonary:     Effort: No respiratory distress.     Breath sounds: Normal breath sounds. No wheezing or rales.  Abdominal:     General: Bowel sounds are normal. There is no distension.     Palpations: Abdomen is soft. There is no mass.     Tenderness: There is no abdominal tenderness. There is no guarding or rebound.  Musculoskeletal: Normal range of motion.  Lymphadenopathy:     Cervical: No cervical adenopathy.  Skin:    Findings: No rash.  Neurological:     Mental Status: She is alert and oriented to person, place, and time.     Cranial Nerves: No cranial nerve deficit.  Deep Tendon Reflexes: Reflexes normal.  Psychiatric:        Behavior: Behavior normal.        Thought Content: Thought content normal.        Judgment: Judgment normal.        Assessment:     Physical exam.  She has remote history of breast cancer.  She has history of mild hyperlipidemia but overall low risk for CAD.  History of osteoporosis but declining further therapies.  She has decided against further DEXA scans.  We discussed the following    Plan:     -Set up repeat colonoscopy -Flu vaccine given.  Patient request lower  dose -Obtain follow-up labs including CBC and competence of metabolic panel with past history of breast cancer -Check 25 hydroxy vitamin D level with her history of osteoporosis -Check lipid panel with history of hyperlipidemia -Continue regular weightbearing exercise -Patient will check on insurance coverage for tetanus booster -We discussed shingles vaccine but she declines at this time  Eulas Post MD [facility]

## 2019-09-21 NOTE — Patient Instructions (Signed)
Consider setting up repeat colonoscopy  Consider Tetanus booster (Tdap)  Vit D- recommend 800- 1,000 IU per day.

## 2019-11-09 ENCOUNTER — Other Ambulatory Visit: Payer: Self-pay

## 2019-11-09 DIAGNOSIS — Z20822 Contact with and (suspected) exposure to covid-19: Secondary | ICD-10-CM

## 2019-11-10 LAB — NOVEL CORONAVIRUS, NAA: SARS-CoV-2, NAA: NOT DETECTED

## 2019-12-17 ENCOUNTER — Ambulatory Visit: Payer: Medicare HMO | Attending: Internal Medicine

## 2019-12-17 DIAGNOSIS — Z20822 Contact with and (suspected) exposure to covid-19: Secondary | ICD-10-CM | POA: Diagnosis not present

## 2019-12-19 LAB — NOVEL CORONAVIRUS, NAA: SARS-CoV-2, NAA: NOT DETECTED

## 2020-01-07 ENCOUNTER — Ambulatory Visit: Payer: Medicare HMO

## 2020-01-15 ENCOUNTER — Ambulatory Visit: Payer: Medicare HMO | Attending: Internal Medicine

## 2020-01-15 DIAGNOSIS — Z23 Encounter for immunization: Secondary | ICD-10-CM | POA: Insufficient documentation

## 2020-01-15 NOTE — Progress Notes (Signed)
   Covid-19 Vaccination Clinic  Name:  Adriana Hill    MRN: [id number] DOB: 1946-02-29  01/15/2020  [name] was observed post Covid-19 immunization for 15 minutes without incidence. She was provided with Vaccine Information Sheet and instruction to access the V-Safe system.   [name] was instructed to call 911 with any severe reactions post vaccine: Marland Kitchen Difficulty breathing  . Swelling of your face and throat  . A fast heartbeat  . A bad rash all over your body  . Dizziness and weakness    Immunizations Administered    Name Date Dose VIS Date Route   Pfizer COVID-19 Vaccine 01/15/2020  5:07 PM 0.3 mL 11/20/2019 Intramuscular   Manufacturer: [place]   Lot: [id number]   [place]: [id number]

## 2020-01-18 ENCOUNTER — Ambulatory Visit: Payer: Medicare HMO

## 2020-02-09 ENCOUNTER — Ambulatory Visit: Payer: Medicare HMO | Attending: Internal Medicine

## 2020-02-09 DIAGNOSIS — Z23 Encounter for immunization: Secondary | ICD-10-CM | POA: Insufficient documentation

## 2020-02-09 NOTE — Progress Notes (Signed)
   Covid-19 Vaccination Clinic  Name:  GRANT MAZZOLA    MRN: [id number] DOB: Dec 13, 1945  02/09/2020  Ms. Fichtner was observed post Covid-19 immunization for 15 minutes without incident. She was provided with Vaccine Information Sheet and instruction to access the V-Safe system.   Ms. Donofrio was instructed to call 911 with any severe reactions post vaccine: Marland Kitchen Difficulty breathing  . Swelling of face and throat  . A fast heartbeat  . A bad rash all over body  . Dizziness and weakness   Immunizations Administered    Name Date Dose VIS Date Route   Pfizer COVID-19 Vaccine 02/09/2020  4:35 PM 0.3 mL 11/20/2019 Intramuscular   Manufacturer: [place]   Lot: [id number]   [place]: [id number]

## 2020-02-16 DIAGNOSIS — R69 Illness, unspecified: Secondary | ICD-10-CM | POA: Diagnosis not present

## 2020-03-29 DIAGNOSIS — C50911 Malignant neoplasm of unspecified site of right female breast: Secondary | ICD-10-CM | POA: Diagnosis not present

## 2020-05-27 ENCOUNTER — Other Ambulatory Visit: Payer: Self-pay | Admitting: Obstetrics and Gynecology

## 2020-05-27 DIAGNOSIS — Z1231 Encounter for screening mammogram for malignant neoplasm of breast: Secondary | ICD-10-CM

## 2020-06-28 ENCOUNTER — Ambulatory Visit
Admission: RE | Admit: 2020-06-28 | Discharge: 2020-06-28 | Disposition: A | Discharge status: Home / Self Care | Payer: Medicare HMO | Source: Ambulatory Visit | Attending: Obstetrics and Gynecology | Admitting: Obstetrics and Gynecology

## 2020-06-28 ENCOUNTER — Other Ambulatory Visit: Payer: Self-pay

## 2020-06-28 DIAGNOSIS — Z1231 Encounter for screening mammogram for malignant neoplasm of breast: Secondary | ICD-10-CM

## 2020-08-10 DIAGNOSIS — Z20822 Contact with and (suspected) exposure to covid-19: Secondary | ICD-10-CM | POA: Diagnosis not present

## 2020-08-18 DIAGNOSIS — R69 Illness, unspecified: Secondary | ICD-10-CM | POA: Diagnosis not present

## 2020-09-07 DIAGNOSIS — Z01419 Encounter for gynecological examination (general) (routine) without abnormal findings: Secondary | ICD-10-CM | POA: Diagnosis not present

## 2020-09-20 ENCOUNTER — Other Ambulatory Visit: Payer: Self-pay

## 2020-09-21 ENCOUNTER — Ambulatory Visit (INDEPENDENT_AMBULATORY_CARE_PROVIDER_SITE_OTHER): Payer: Medicare HMO | Admitting: Family Medicine

## 2020-09-21 ENCOUNTER — Encounter: Payer: Self-pay | Admitting: Family Medicine

## 2020-09-21 ENCOUNTER — Other Ambulatory Visit: Payer: Self-pay

## 2020-09-21 VITALS — BP 122/84 | HR 100 | Temp 98.4°F | Ht 62.0 in | Wt 125.1 lb

## 2020-09-21 DIAGNOSIS — Z0000 Encounter for general adult medical examination without abnormal findings: Secondary | ICD-10-CM | POA: Diagnosis not present

## 2020-09-21 DIAGNOSIS — Z1211 Encounter for screening for malignant neoplasm of colon: Secondary | ICD-10-CM

## 2020-09-21 DIAGNOSIS — E559 Vitamin D deficiency, unspecified: Secondary | ICD-10-CM

## 2020-09-21 DIAGNOSIS — Z23 Encounter for immunization: Secondary | ICD-10-CM | POA: Diagnosis not present

## 2020-09-21 MED ORDER — MUPIROCIN 2 % EX OINT: 1.0000 "application " | TOPICAL_OINTMENT | Freq: Two times a day (BID) | CUTANEOUS | 1 refills | Status: AC

## 2020-09-21 NOTE — Progress Notes (Addendum)
Established Patient Office Visit  Subjective:  Patient ID: Adriana Hill, female    DOB: 02/19/1946  Age: 74 y.o. MRN: [id number]  CC:  Chief Complaint  Patient presents with  . Medicare Wellness    sore in nose that bleeds in the mornings and also wats to talk about proobiotic    HPI [name] presents for physical exam.  She has past history of breast cancer.  She is still seen by GYN yearly.  Generally very healthy.  She does have history of osteoporosis.  She had previous radiation and has declined any osteoporosis therapies.  Last DEXA scan was years ago.  She does have history of mild vitamin D deficiency with level of 24 when last checked  She has acute issue of sore area left naris.  Noted couple weeks ago.  She is has very dry nasal passages.  Maintenance reviewed:  -Flu vaccine-request today -Covid vaccine already given -Pneumonia vaccines complete -Prior hepatitis C antibody screening negative -Gets yearly mammograms with most recent 06/28/2020 -Overdue for colonoscopy.  She will consider Cologuard.  No prior history of polyps -Tetanus due but she declines at this time  Family history-unknown as she is adopted  Social history.  She is single.  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  Has never smoked.  No regular alcohol use.  She has 1 daughter.  She had another child that died age 36 of cerebral palsy complications.  2 grandchildren.  The 10-year ASCVD risk score Mikey Bussing DC Brooke Bonito., et al., 2013) is: 11%   Values used to calculate the score:     Age: 40 years     Sex: Female     Is Non-Hispanic African American: No     Diabetic: No     Tobacco smoker: No     Systolic Blood Pressure: 948 mmHg     Is BP treated: No     HDL Cholesterol: 61 mg/dL     Total Cholesterol: 237 mg/dL   Past Medical History:  Diagnosis Date  . Allergy    hay fever  . Blood in stool   . Cancer [facility]) 2004   breast  . Personal history of chemotherapy   .  Personal history of radiation therapy     Past Surgical History:  Procedure Laterality Date  . MASTECTOMY Right 2004   right    Family History  Adopted: Yes  Problem Relation Age of Onset  . Breast cancer Neg Hx     Social History   Socioeconomic History  . Marital status: Single    Spouse name: Not on file  . Number of children: Not on file  . Years of education: Not on file  . Highest education level: Not on file  Occupational History  . Not on file  Tobacco Use  . Smoking status: Never Smoker  . Smokeless tobacco: Never Used  Vaping Use  . Vaping Use: Never used  Substance and Sexual Activity  . Alcohol use: Yes  . Drug use: Not on file  . Sexual activity: Not on file  Other Topics Concern  . Not on file  Social History Narrative  . Not on file   Social Determinants of Health   Financial Resource Strain:   . Difficulty of Paying Living Expenses: Not on file  Food Insecurity:   . Worried About Charity fundraiser in the Last Year: Not on file  . Ran Out of Food in the  Last Year: Not on file  Transportation Needs:   . Lack of Transportation (Medical): Not on file  . Lack of Transportation (Non-Medical): Not on file  Physical Activity:   . Days of Exercise per Week: Not on file  . Minutes of Exercise per Session: Not on file  Stress:   . Feeling of Stress : Not on file  Social Connections:   . Frequency of Communication with Friends and Family: Not on file  . Frequency of Social Gatherings with Friends and Family: Not on file  . Attends Religious Services: Not on file  . Active Member of Clubs or Organizations: Not on file  . Attends Archivist Meetings: Not on file  . Marital Status: Not on file  Intimate Partner Violence:   . Fear of Current or Ex-Partner: Not on file  . Emotionally Abused: Not on file  . Physically Abused: Not on file  . Sexually Abused: Not on file    Outpatient Medications Prior to Visit  Medication Sig Dispense  Refill  . calcium-vitamin D (OSCAL WITH D) 500-200 MG-UNIT tablet Take 1 tablet by mouth.    . hydrocortisone cream 1 % Apply 1 application topically 2 (two) times daily as needed for itching.    . polyethylene glycol (MIRALAX / GLYCOLAX) packet Take 17 g by mouth daily as needed for mild constipation.     No facility-administered medications prior to visit.    Allergies  Allergen Reactions  . Dicyclomine Hcl Palpitations    Patient had a reaction of tachycardia palpitations 30 minutes after taking medication  . Flagyl [Metronidazole] Anaphylaxis  . Augmentin [Amoxicillin-Pot Clavulanate] Diarrhea  . Theophyllines Rash    ROS Review of Systems  Constitutional: Negative for activity change, appetite change, fatigue, fever and unexpected weight change.  HENT: Negative for ear pain, hearing loss, sore throat and trouble swallowing.   Eyes: Negative for visual disturbance.  Respiratory: Negative for cough and shortness of breath.   Cardiovascular: Negative for chest pain and palpitations.  Gastrointestinal: Negative for abdominal pain, blood in stool, constipation and diarrhea.  Endocrine: Negative for polydipsia and polyuria.  Genitourinary: Negative for dysuria and hematuria.  Musculoskeletal: Negative for arthralgias, back pain and myalgias.  Skin: Negative for rash.  Neurological: Negative for dizziness, syncope and headaches.  Hematological: Negative for adenopathy.  Psychiatric/Behavioral: Negative for confusion and dysphoric mood.      Objective:    Physical Exam Constitutional:      Appearance: She is well-developed.  HENT:     Head: Normocephalic and atraumatic.     Nose:     Comments: Very small superficial ulcer right naris Eyes:     Pupils: Pupils are equal, round, and reactive to light.  Neck:     Thyroid: No thyromegaly.  Cardiovascular:     Rate and Rhythm: Normal rate and regular rhythm.     Heart sounds: Normal heart sounds. No murmur heard.    Pulmonary:     Effort: No respiratory distress.     Breath sounds: Normal breath sounds. No wheezing or rales.  Abdominal:     General: Bowel sounds are normal. There is no distension.     Palpations: Abdomen is soft. There is no mass.     Tenderness: There is no abdominal tenderness. There is no guarding or rebound.  Musculoskeletal:        General: Normal range of motion.     Cervical back: Normal range of motion and neck supple.  Lymphadenopathy:  Cervical: No cervical adenopathy.  Skin:    Findings: No rash.  Neurological:     Mental Status: She is alert and oriented to person, place, and time.     Cranial Nerves: No cranial nerve deficit.     Deep Tendon Reflexes: Reflexes normal.  Psychiatric:        Behavior: Behavior normal.        Thought Content: Thought content normal.        Judgment: Judgment normal.     BP 122/84   Pulse 100   Temp 98.4 F (36.9 C)   Ht 5\' 2"  (1.575 m)   Wt 125 lb 1.6 oz (56.7 kg)   SpO2 94%   BMI 22.88 kg/m  Wt Readings from Last 3 Encounters:  09/21/20 125 lb 1.6 oz (56.7 kg)  09/21/19 121 lb 3.2 oz (55 kg)  09/16/18 114 lb 4.8 oz (51.8 kg)     Health Maintenance Due  Topic Date Due  . TETANUS/TDAP  Never done  . COLONOSCOPY  12/18/2017    There are no preventive care reminders to display for this patient.  Lab Results  Component Value Date   TSH 2.32 09/16/2018   Lab Results  Component Value Date   WBC 6.1 09/21/2019   HGB 12.4 09/21/2019   HCT 38.0 09/21/2019   MCV 95.6 09/21/2019   PLT 288.0 09/21/2019   Lab Results  Component Value Date   NA 139 09/21/2019   K 3.9 09/21/2019   CHLORIDE 104 09/28/2013   CO2 30 09/21/2019   GLUCOSE 76 09/21/2019   BUN 12 09/21/2019   CREATININE 0.87 09/21/2019   BILITOT 0.4 09/21/2019   ALKPHOS 62 09/21/2019   AST 20 09/21/2019   ALT 13 09/21/2019   PROT 6.9 09/21/2019   ALBUMIN 3.9 09/21/2019   CALCIUM 9.0 09/21/2019   ANIONGAP 7 02/11/2017   GFR 64.10 09/21/2019    Lab Results  Component Value Date   CHOL 221 (H) 09/21/2019   Lab Results  Component Value Date   HDL 52.00 09/21/2019   Lab Results  Component Value Date   LDLCALC 144 (H) 09/21/2019   Lab Results  Component Value Date   TRIG 125.0 09/21/2019   Lab Results  Component Value Date   CHOLHDL 4 09/21/2019   No results found for: HGBA1C    Assessment & Plan:   #1 physical exam.  We discussed the following health maintenance issues  -Flu vaccine given -Consider Shingrix and she will check at pharmacy if interested -She will continue with yearly mammograms -Screening labs obtained.  Include vitamin D level with prior history of low levels -Continue regular weightbearing exercise -Set up Cologuard  #2 small ulcer left naris.  This looks like this may have been some dry skin that simply cracked.  She will try some Bactroban ointment twice daily for the next week or 2 and be in touch if this is not healing in 2 weeks  Meds ordered this encounter  Medications  . mupirocin ointment (BACTROBAN) 2 %    Sig: Apply 1 application topically 2 (two) times daily.    Dispense:  22 g    Refill:  1    Follow-up: No follow-ups on file.    Carolann Littler, MD

## 2020-09-21 NOTE — Patient Instructions (Signed)
Preventive Care 38 Years and Older, Female Preventive care refers to lifestyle choices and visits with your health care provider that can promote health and wellness. This includes:  A yearly physical exam. This is also called an annual well check.  Regular dental and eye exams.  Immunizations.  Screening for certain conditions.  Healthy lifestyle choices, such as diet and exercise. What can I expect for my preventive care visit? Physical exam Your health care provider will check:  Height and weight. These may be used to calculate body mass index (BMI), which is a measurement that tells if you are at a healthy weight.  Heart rate and blood pressure.  Your skin for abnormal spots. Counseling Your health care provider may ask you questions about:  Alcohol, tobacco, and drug use.  Emotional well-being.  Home and relationship well-being.  Sexual activity.  Eating habits.  History of falls.  Memory and ability to understand (cognition).  Work and work Statistician.  Pregnancy and menstrual history. What immunizations do I need?  Influenza (flu) vaccine  This is recommended every year. Tetanus, diphtheria, and pertussis (Tdap) vaccine  You may need a Td booster every 10 years. Varicella (chickenpox) vaccine  You may need this vaccine if you have not already been vaccinated. Zoster (shingles) vaccine  You may need this after age 72. Pneumococcal conjugate (PCV13) vaccine  One dose is recommended after age 72. Pneumococcal polysaccharide (PPSV23) vaccine  One dose is recommended after age 72. Measles, mumps, and rubella (MMR) vaccine  You may need at least one dose of MMR if you were born in 1957 or later. You may also need a second dose. Meningococcal conjugate (MenACWY) vaccine  You may need this if you have certain conditions. Hepatitis A vaccine  You may need this if you have certain conditions or if you travel or work in places where you may be exposed  to hepatitis A. Hepatitis B vaccine  You may need this if you have certain conditions or if you travel or work in places where you may be exposed to hepatitis B. Haemophilus influenzae type b (Hib) vaccine  You may need this if you have certain conditions. You may receive vaccines as individual doses or as more than one vaccine together in one shot (combination vaccines). Talk with your health care provider about the risks and benefits of combination vaccines. What tests do I need? Blood tests  Lipid and cholesterol levels. These may be checked every 5 years, or more frequently depending on your overall health.  Hepatitis C test.  Hepatitis B test. Screening  Lung cancer screening. You may have this screening every year starting at age 72 if you have a 30-pack-year history of smoking and currently smoke or have quit within the past 15 years.  Colorectal cancer screening. All adults should have this screening starting at age 72 and continuing until age 15. Your health care provider may recommend screening at age 23 if you are at increased risk. You will have tests every 1-10 years, depending on your results and the type of screening test.  Diabetes screening. This is done by checking your blood sugar (glucose) after you have not eaten for a while (fasting). You may have this done every 1-3 years.  Mammogram. This may be done every 1-2 years. Talk with your health care provider about how often you should have regular mammograms.  BRCA-related cancer screening. This may be done if you have a family history of breast, ovarian, tubal, or peritoneal cancers.  Other tests  Sexually transmitted disease (STD) testing.  Bone density scan. This is done to screen for osteoporosis. You may have this done starting at age 72. Follow these instructions at home: Eating and drinking  Eat a diet that includes fresh fruits and vegetables, whole grains, lean protein, and low-fat dairy products. Limit  your intake of foods with high amounts of sugar, saturated fats, and salt.  Take vitamin and mineral supplements as recommended by your health care provider.  Do not drink alcohol if your health care provider tells you not to drink.  If you drink alcohol: ? Limit how much you have to 0-1 drink a day. ? Be aware of how much alcohol is in your drink. In the [place] one drink equals one 12 oz bottle of beer (355 mL), one 5 oz glass of wine (148 mL), or one 1 oz glass of hard liquor (44 mL). Lifestyle  Take daily care of your teeth and gums.  Stay active. Exercise for at least 30 minutes on 5 or more days each week.  Do not use any products that contain nicotine or tobacco, such as cigarettes, e-cigarettes, and chewing tobacco. If you need help quitting, ask your health care provider.  If you are sexually active, practice safe sex. Use a condom or other form of protection in order to prevent STIs (sexually transmitted infections).  Talk with your health care provider about taking a low-dose aspirin or statin. What's next?  Go to your health care provider once a year for a well check visit.  Ask your health care provider how often you should have your eyes and teeth checked.  Stay up to date on all vaccines. This information is not intended to replace advice given to you by your health care provider. Make sure you discuss any questions you have with your health care provider. Document Revised: 11/20/2018 Document Reviewed: 11/20/2018 Elsevier Patient Education  2020 [employer].

## 2020-09-22 ENCOUNTER — Other Ambulatory Visit: Payer: Self-pay | Admitting: Family Medicine

## 2020-09-22 DIAGNOSIS — E785 Hyperlipidemia, unspecified: Secondary | ICD-10-CM

## 2020-09-22 LAB — HEPATIC FUNCTION PANEL
AG Ratio: 1.3 (calc) (ref 1.0–2.5)
ALT: 11 U/L (ref 6–29)
AST: 19 U/L (ref 10–35)
Albumin: 4 g/dL (ref 3.6–5.1)
Alkaline phosphatase (APISO): 58 U/L (ref 37–153)
Bilirubin, Direct: 0.1 mg/dL (ref 0.0–0.2)
Globulin: 3.2 g/dL (calc) (ref 1.9–3.7)
Indirect Bilirubin: 0.4 mg/dL (calc) (ref 0.2–1.2)
Total Bilirubin: 0.5 mg/dL (ref 0.2–1.2)
Total Protein: 7.2 g/dL (ref 6.1–8.1)

## 2020-09-22 LAB — VITAMIN D 25 HYDROXY (VIT D DEFICIENCY, FRACTURES): Vit D, 25-Hydroxy: 34 ng/mL (ref 30–100)

## 2020-09-22 LAB — CBC WITH DIFFERENTIAL/PLATELET
Absolute Monocytes: 717 cells/uL (ref 200–950)
Basophils Absolute: 57 cells/uL (ref 0–200)
Basophils Relative: 0.8 %
Eosinophils Absolute: 220 cells/uL (ref 15–500)
Eosinophils Relative: 3.1 %
HCT: 37.9 % (ref 35.0–45.0)
Hemoglobin: 12.4 g/dL (ref 11.7–15.5)
Lymphs Abs: 2158 cells/uL (ref 850–3900)
MCH: 30.9 pg (ref 27.0–33.0)
MCHC: 32.7 g/dL (ref 32.0–36.0)
MCV: 94.5 fL (ref 80.0–100.0)
MPV: 10.6 fL (ref 7.5–12.5)
Monocytes Relative: 10.1 %
Neutro Abs: 3948 cells/uL (ref 1500–7800)
Neutrophils Relative %: 55.6 %
Platelets: 229 10*3/uL (ref 140–400)
RBC: 4.01 10*6/uL (ref 3.80–5.10)
RDW: 12.3 % (ref 11.0–15.0)
Total Lymphocyte: 30.4 %
WBC: 7.1 10*3/uL (ref 3.8–10.8)

## 2020-09-22 LAB — LIPID PANEL
Cholesterol: 237 mg/dL — ABNORMAL HIGH (ref <200)
HDL: 61 mg/dL (ref >=50)
LDL Cholesterol (Calc): 157 mg/dL (calc) — ABNORMAL HIGH
Non-HDL Cholesterol (Calc): 176 mg/dL (calc) — ABNORMAL HIGH (ref <130)
Total CHOL/HDL Ratio: 3.9 (calc) (ref <5.0)
Triglycerides: 87 mg/dL (ref <150)

## 2020-09-22 LAB — BASIC METABOLIC PANEL
BUN: 15 mg/dL (ref 7–25)
CO2: 27 mmol/L (ref 20–32)
Calcium: 9 mg/dL (ref 8.6–10.4)
Chloride: 103 mmol/L (ref 98–110)
Creat: 0.89 mg/dL (ref 0.60–0.93)
Glucose, Bld: 98 mg/dL (ref 65–99)
Potassium: 4.7 mmol/L (ref 3.5–5.3)
Sodium: 138 mmol/L (ref 135–146)

## 2020-09-22 LAB — TSH: TSH: 2.25 mIU/L (ref 0.40–4.50)

## 2020-09-22 NOTE — Telephone Encounter (Signed)
-----   Message from Eulas Post, MD sent at 09/22/2020  2:50 PM EDT ----- Labs all look good except for elevated cholesterol.   Calculated 10 year risk of CAD event 11%.   Keep saturated fat intake down.   Since she is adopted, family hx of CAD unknown.  If she is willing to try a statin, would consider Crestor 10 mg po qd and repeat lipids and hepatic in 8 weeks.

## 2020-09-22 NOTE — Telephone Encounter (Signed)
LMOVM for pt to call back/although she has seen the lab results I need to know if she is willing to try Crestor/and if she is then create lab visit and future orders for 8-weeks-out for Lipid and HFP per PCP/thx dmf

## 2020-09-23 MED ORDER — ROSUVASTATIN CALCIUM 5 MG PO TABS: 5.0000 mg | ORAL_TABLET | Freq: Every day | ORAL | 2 refills | Status: DC

## 2020-09-23 NOTE — Addendum Note (Signed)
Addended by: Marrion Coy on: 09/23/2020 03:15 PM   Modules accepted: Orders

## 2020-09-23 NOTE — Telephone Encounter (Signed)
SA-I called in Crestor to pharmacy and selected phone in/can you please sign order?thx dmf

## 2020-09-23 NOTE — Addendum Note (Signed)
Addended by: Rodrigo Ran on: 09/23/2020 03:21 PM   Modules accepted: Orders

## 2020-09-23 NOTE — Telephone Encounter (Signed)
Pt aware that Crestor is not in the same family as the Fosamax that she cannot take/she agrees to start Crestor at lowest dosage of 5mg  per PCP/LMOVM advising of this/future ordered labs/called in order for Crestor 5mg  1qd #30+2/thx dmf

## 2020-10-04 DIAGNOSIS — Z1211 Encounter for screening for malignant neoplasm of colon: Secondary | ICD-10-CM | POA: Diagnosis not present

## 2020-10-04 LAB — COLOGUARD: Cologuard: NEGATIVE

## 2020-10-21 LAB — COLOGUARD: Cologuard: NEGATIVE

## 2020-11-08 NOTE — Addendum Note (Signed)
Addended by: Marrion Coy on: 11/08/2020 03:15 PM   Modules accepted: Orders

## 2020-11-23 ENCOUNTER — Other Ambulatory Visit: Payer: Medicare HMO

## 2020-11-23 ENCOUNTER — Other Ambulatory Visit: Payer: Self-pay

## 2020-11-23 DIAGNOSIS — E785 Hyperlipidemia, unspecified: Secondary | ICD-10-CM

## 2020-11-23 NOTE — Addendum Note (Signed)
Addended by: Marrion Coy on: 11/23/2020 09:21 AM   Modules accepted: Orders

## 2020-11-24 LAB — LIPID PANEL
Cholesterol: 167 mg/dL (ref <200)
HDL: 62 mg/dL (ref >=50)
LDL Cholesterol (Calc): 86 mg/dL (calc)
Non-HDL Cholesterol (Calc): 105 mg/dL (calc) (ref <130)
Total CHOL/HDL Ratio: 2.7 (calc) (ref <5.0)
Triglycerides: 92 mg/dL (ref <150)

## 2020-11-24 LAB — HEPATIC FUNCTION PANEL
AG Ratio: 1.4 (calc) (ref 1.0–2.5)
ALT: 13 U/L (ref 6–29)
AST: 20 U/L (ref 10–35)
Albumin: 3.9 g/dL (ref 3.6–5.1)
Alkaline phosphatase (APISO): 53 U/L (ref 37–153)
Bilirubin, Direct: 0.1 mg/dL (ref 0.0–0.2)
Globulin: 2.7 g/dL (calc) (ref 1.9–3.7)
Indirect Bilirubin: 0.5 mg/dL (calc) (ref 0.2–1.2)
Total Bilirubin: 0.6 mg/dL (ref 0.2–1.2)
Total Protein: 6.6 g/dL (ref 6.1–8.1)

## 2020-12-07 DIAGNOSIS — D2271 Melanocytic nevi of right lower limb, including hip: Secondary | ICD-10-CM | POA: Diagnosis not present

## 2020-12-07 DIAGNOSIS — L814 Other melanin hyperpigmentation: Secondary | ICD-10-CM | POA: Diagnosis not present

## 2020-12-07 DIAGNOSIS — D2261 Melanocytic nevi of right upper limb, including shoulder: Secondary | ICD-10-CM | POA: Diagnosis not present

## 2020-12-07 DIAGNOSIS — D2272 Melanocytic nevi of left lower limb, including hip: Secondary | ICD-10-CM | POA: Diagnosis not present

## 2020-12-07 DIAGNOSIS — D2262 Melanocytic nevi of left upper limb, including shoulder: Secondary | ICD-10-CM | POA: Diagnosis not present

## 2020-12-07 DIAGNOSIS — D225 Melanocytic nevi of trunk: Secondary | ICD-10-CM | POA: Diagnosis not present

## 2020-12-14 ENCOUNTER — Ambulatory Visit: Payer: Medicare HMO

## 2020-12-19 ENCOUNTER — Other Ambulatory Visit: Payer: Self-pay | Admitting: Family Medicine

## 2020-12-19 DIAGNOSIS — E785 Hyperlipidemia, unspecified: Secondary | ICD-10-CM

## 2020-12-29 ENCOUNTER — Ambulatory Visit: Payer: Medicare HMO

## 2021-01-13 ENCOUNTER — Ambulatory Visit (INDEPENDENT_AMBULATORY_CARE_PROVIDER_SITE_OTHER): Payer: Medicare HMO

## 2021-01-13 DIAGNOSIS — Z0000 Encounter for general adult medical examination without abnormal findings: Secondary | ICD-10-CM

## 2021-01-13 NOTE — Progress Notes (Signed)
Subjective:   Adriana Hill is a 75 y.o. female who presents for an Initial Medicare Annual Wellness Visit.   Virtual Visit via Video Note  I connected with Adriana Hill by a video enabled telemedicine application and verified that I am speaking with the correct person using two identifiers.  Location: Patient: Home Provider: Office Persons participating in the virtual visit: patient, provider   I discussed the limitations of evaluation and management by telemedicine and the availability of in person appointments. The patient expressed understanding and agreed to proceed.     Larene Beach Lynora Dymond,LPN   Review of Systems    N/A  Cardiac Risk Factors include: advanced age (>48men, >10 women);dyslipidemia     Objective:    Today's Vitals   There is no height or weight on file to calculate BMI.  Advanced Directives 01/13/2021 02/11/2017  Does Patient Have a Medical Advance Directive? Yes Yes  Type of Paramedic of [place];Living will [place]  Does patient want to make changes to medical advance directive? No - Patient declined -  Copy of [place] in Chart? No - copy requested -    Current Medications (verified) Outpatient Encounter Medications as of 01/13/2021  Medication Sig  . calcium-vitamin D (OSCAL WITH D) 500-200 MG-UNIT tablet Take 1 tablet by mouth.  . hydrocortisone cream 1 % Apply 1 application topically 2 (two) times daily as needed for itching.  . mupirocin ointment (BACTROBAN) 2 % Apply 1 application topically 2 (two) times daily.  . rosuvastatin (CRESTOR) 5 MG tablet TAKE 1 TABLET BY MOUTH AT BEDTIME. SCHEDULE LABS IN 2 MONTHS   No facility-administered encounter medications on file as of 01/13/2021.    Allergies (verified) Dicyclomine hcl, Flagyl [metronidazole], Augmentin [amoxicillin-pot clavulanate], and Theophyllines   History: Past Medical History:  Diagnosis Date  . Allergy    hay fever  .  Blood in stool   . Cancer [facility]) 2004   breast  . Personal history of chemotherapy   . Personal history of radiation therapy    Past Surgical History:  Procedure Laterality Date  . MASTECTOMY Right 2004   right   Family History  Adopted: Yes  Problem Relation Age of Onset  . Breast cancer Neg Hx    Social History   Socioeconomic History  . Marital status: Single    Spouse name: Not on file  . Number of children: Not on file  . Years of education: Not on file  . Highest education level: Not on file  Occupational History  . Not on file  Tobacco Use  . Smoking status: Never Smoker  . Smokeless tobacco: Never Used  Vaping Use  . Vaping Use: Never used  Substance and Sexual Activity  . Alcohol use: Yes  . Drug use: Not on file  . Sexual activity: Not on file  Other Topics Concern  . Not on file  Social History Narrative  . Not on file   Social Determinants of Health   Financial Resource Strain: Low Risk   . Difficulty of Paying Living Expenses: Not hard at all  Food Insecurity: No Food Insecurity  . Worried About Charity fundraiser in the Last Year: Never true  . Ran Out of Food in the Last Year: Never true  Transportation Needs: No Transportation Needs  . Lack of Transportation (Medical): No  . Lack of Transportation (Non-Medical): No  Physical Activity: Inactive  . Days of Exercise per Week: 0  days  . Minutes of Exercise per Session: 0 min  Stress: No Stress Concern Present  . Feeling of Stress : Not at all  Social Connections: Moderately Integrated  . Frequency of Communication with Friends and Family: More than three times a week  . Frequency of Social Gatherings with Friends and Family: Once a week  . Attends Religious Services: More than 4 times per year  . Active Member of Clubs or Organizations: Yes  . Attends Archivist Meetings: More than 4 times per year  . Marital Status: Divorced    Tobacco Counseling Counseling given: Not  Answered   Clinical Intake:  Pre-visit preparation completed: Yes  Pain : No/denies pain     Nutritional Risks: None Diabetes: No  How often do you need to have someone help you when you read instructions, pamphlets, or other written materials from your doctor or pharmacy?: 1 - Never What is the last grade level you completed in school?: 1 year of college  Diabetic?No  Interpreter Needed?: No  Information entered by :: [place] of Daily Living In your present state of health, do you have any difficulty performing the following activities: 01/13/2021 09/21/2020  Hearing? N N  Vision? N N  Difficulty concentrating or making decisions? N N  Walking or climbing stairs? N N  Dressing or bathing? N N  Doing errands, shopping? N N  Preparing Food and eating ? N -  Using the Toilet? N -  In the past six months, have you accidently leaked urine? N -  Do you have problems with loss of bowel control? N -  Managing your Medications? N -  Managing your Finances? N -  Housekeeping or managing your Housekeeping? N -  Some recent data might be hidden    Patient Care Team: Eulas Post, MD as PCP - General (Family Medicine)  Indicate any recent Medical Services you may have received from other than [facility] providers in the past year (date may be approximate).     Assessment:   This is a routine wellness examination for Adriana Hill.  Hearing/Vision screen  Hearing Screening   125Hz  250Hz  500Hz  1000Hz  2000Hz  3000Hz  4000Hz  6000Hz  8000Hz   Right ear:           Left ear:           Vision Screening Comments: Patient states gets eyes examined once per year   Dietary issues and exercise activities discussed: Current Exercise Habits: The patient does not participate in regular exercise at present  Goals    . Exercise 150 min/wk Moderate Activity      Depression Screen PHQ 2/9 Scores 01/13/2021 09/21/2020 09/21/2019 09/16/2018 09/11/2017 09/23/2015 04/21/2015  PHQ - 2  Score 0 0 0 0 0 0 0    Fall Risk Fall Risk  01/13/2021 09/21/2020 09/21/2019 09/16/2018 09/11/2017  Falls in the past year? 0 0 0 No No  Number falls in past yr: 0 0 - - -  Injury with Fall? 0 0 - - -  Risk for fall due to : No Fall Risks - - - -  Follow up Falls evaluation completed;Falls prevention discussed - - - -    FALL RISK PREVENTION PERTAINING TO THE HOME:   Any stairs in or around the home? Yes  If so, are there any without handrails? No  Home free of loose throw rugs in walkways, pet beds, electrical cords, etc? Yes  Adequate lighting in your home to reduce risk of  falls? Yes   ASSISTIVE DEVICES UTILIZED TO PREVENT FALLS:  Life alert? No  Use of a cane, walker or w/c? No  Grab bars in the bathroom? No  Shower chair or bench in shower? No  Elevated toilet seat or a handicapped toilet? No    Cognitive Function:   Normal cognitive status assessed by direct observation by this Nurse Health Advisor. No abnormalities found.     6CIT Screen 09/21/2020  What Year? 0 points  What month? 0 points  What time? 0 points  Count back from 20 0 points  Months in reverse 0 points  Repeat phrase 0 points  Total Score 0    Immunizations Immunization History  Administered Date(s) Administered  . Fluad Quad(high Dose 65+) 09/21/2020  . Influenza,inj,Quad PF,6+ Mos 09/16/2018, 09/21/2019  . Influenza-Unspecified 10/10/2013, 10/12/2016  . PFIZER(Purple Top)SARS-COV-2 Vaccination 01/15/2020, 02/09/2020  . Pneumococcal Conjugate-13 09/11/2017  . Pneumococcal Polysaccharide-23 09/30/2018    TDAP status: Due, Education has been provided regarding the importance of this vaccine. Advised may receive this vaccine at local pharmacy or Health Dept. Aware to provide a copy of the vaccination record if obtained from local pharmacy or Health Dept. Verbalized acceptance and understanding.  Flu Vaccine status: Up to date  Pneumococcal vaccine status: Up to date  Covid-19 vaccine status:  Completed vaccines  Qualifies for Shingles Vaccine? Yes   Zostavax completed No   Shingrix Completed?: No.    Education has been provided regarding the importance of this vaccine. Patient has been advised to call insurance company to determine out of pocket expense if they have not yet received this vaccine. Advised may also receive vaccine at local pharmacy or Health Dept. Verbalized acceptance and understanding.  Screening Tests Health Maintenance  Topic Date Due  . TETANUS/TDAP  Never done  . COVID-19 Vaccine (3 - Pfizer risk 4-dose series) 03/08/2020  . MAMMOGRAM  06/28/2022  . Fecal DNA (Cologuard)  10/05/2023  . INFLUENZA VACCINE  Completed  . DEXA SCAN  Completed  . Hepatitis C Screening  Completed  . PNA vac Low Risk Adult  Completed    Health Maintenance  Health Maintenance Due  Topic Date Due  . TETANUS/TDAP  Never done  . COVID-19 Vaccine (3 - Pfizer risk 4-dose series) 03/08/2020    Colorectal cancer screening: Type of screening: Cologuard. Completed 10/04/2020. Repeat every 3 years  Mammogram status: Completed 06/28/2020. Repeat every year  Bone Density status: Completed 09/14/2013. Results reflect: Bone density results: OSTEOPENIA. Repeat every 5 years.  Lung Cancer Screening: (Low Dose CT Chest recommended if Age 69-80 years, 30 pack-year currently smoking OR have quit w/in 15years.) does not qualify.   Lung Cancer Screening Referral: N/A  Additional Screening:  Hepatitis C Screening: does qualify; Completed 09/11/2017  Vision Screening: Recommended annual ophthalmology exams for early detection of glaucoma and other disorders of the eye. Is the patient up to date with their annual eye exam?  Yes  Who is the provider or what is the name of the office in which the patient attends annual eye exams? Dr. Bing Plume If pt is not established with a provider, would they like to be referred to a provider to establish care? No .   Dental Screening: Recommended annual  dental exams for proper oral hygiene  Community Resource Referral / Chronic Care Management: CRR required this visit?  No   CCM required this visit?  No      Plan:     I have personally reviewed and noted  the following in the patient's chart:   . Medical and social history . Use of alcohol, tobacco or illicit drugs  . Current medications and supplements . Functional ability and status . Nutritional status . Physical activity . Advanced directives . List of other physicians . Hospitalizations, surgeries, and ER visits in previous 12 months . Vitals . Screenings to include cognitive, depression, and falls . Referrals and appointments  In addition, I have reviewed and discussed with patient certain preventive protocols, quality metrics, and best practice recommendations. A written personalized care plan for preventive services as well as general preventive health recommendations were provided to patient.     Ofilia Neas, LPN   04/13/9740   Nurse Notes: None

## 2021-01-13 NOTE — Patient Instructions (Signed)
Adriana Hill , Thank you for taking time to come for your Medicare Wellness Visit. I appreciate your ongoing commitment to your health goals. Please review the following plan we discussed and let me know if I can assist you in the future.   Screening recommendations/referrals: Colonoscopy: Up to date, next cologuard due 10/05/2023 Mammogram: Up to date, next due 06/28/2021 Bone Density: Currently due for a repeat bone density. Last bone density on 09/14/2013 showed osteopenia. Recommendations are to repeat 5 years after. Please let us know when you would like [place] to get this set up for you. Recommended yearly ophthalmology/optometry visit for glaucoma screening and checkup Recommended yearly dental visit for hygiene and checkup  Vaccinations: Influenza vaccine: Up to date, next due fall 2022  Pneumococcal vaccine: Completed series  Tdap vaccine: Currently due, if you wish to receive you may await and injury to do so at it will then be covered by insurance.  Shingles vaccine: Currently due for shingrix, if you wish to receive we recommend that you do so at your local pharmacy.     Advanced directives: Please bring copies of your advanced medical directives so that we may scan them into your chart.   Conditions/risks identified: None   Next appointment: None    Preventive Care 65 Years and Older, Female Preventive care refers to lifestyle choices and visits with your health care provider that can promote health and wellness. What does preventive care include?  A yearly physical exam. This is also called an annual well check.  Dental exams once or twice a year.  Routine eye exams. Ask your health care provider how often you should have your eyes checked.  Personal lifestyle choices, including:  Daily care of your teeth and gums.  Regular physical activity.  Eating a healthy diet.  Avoiding tobacco and drug use.  Limiting alcohol use.  Practicing safe sex.  Taking low-dose  aspirin every day.  Taking vitamin and mineral supplements as recommended by your health care provider. What happens during an annual well check? The services and screenings done by your health care provider during your annual well check will depend on your age, overall health, lifestyle risk factors, and family history of disease. Counseling  Your health care provider may ask you questions about your:  Alcohol use.  Tobacco use.  Drug use.  Emotional well-being.  Home and relationship well-being.  Sexual activity.  Eating habits.  History of falls.  Memory and ability to understand (cognition).  Work and work Statistician.  Reproductive health. Screening  You may have the following tests or measurements:  Height, weight, and BMI.  Blood pressure.  Lipid and cholesterol levels. These may be checked every 5 years, or more frequently if you are over 24 years old.  Skin check.  Lung cancer screening. You may have this screening every year starting at age 49 if you have a 30-pack-year history of smoking and currently smoke or have quit within the past 15 years.  Fecal occult blood test (FOBT) of the stool. You may have this test every year starting at age 7.  Flexible sigmoidoscopy or colonoscopy. You may have a sigmoidoscopy every 5 years or a colonoscopy every 10 years starting at age 35.  Hepatitis C blood test.  Hepatitis B blood test.  Sexually transmitted disease (STD) testing.  Diabetes screening. This is done by checking your blood sugar (glucose) after you have not eaten for a while (fasting). You may have this done every 1-3 years.  Bone density scan. This is done to screen for osteoporosis. You may have this done starting at age 59.  Mammogram. This may be done every 1-2 years. Talk to your health care provider about how often you should have regular mammograms. Talk with your health care provider about your test results, treatment options, and if  necessary, the need for more tests. Vaccines  Your health care provider may recommend certain vaccines, such as:  Influenza vaccine. This is recommended every year.  Tetanus, diphtheria, and acellular pertussis (Tdap, Td) vaccine. You may need a Td booster every 10 years.  Zoster vaccine. You may need this after age 11.  Pneumococcal 13-valent conjugate (PCV13) vaccine. One dose is recommended after age 6.  Pneumococcal polysaccharide (PPSV23) vaccine. One dose is recommended after age 97. Talk to your health care provider about which screenings and vaccines you need and how often you need them. This information is not intended to replace advice given to you by your health care provider. Make sure you discuss any questions you have with your health care provider. Document Released: 12/23/2015 Document Revised: 08/15/2016 Document Reviewed: 09/27/2015 Elsevier Interactive Patient Education  2017 [place] Prevention in the Home Falls can cause injuries. They can happen to people of all ages. There are many things you can do to make your home safe and to help prevent falls. What can I do on the outside of my home?  Regularly fix the edges of walkways and driveways and fix any cracks.  Remove anything that might make you trip as you walk through a door, such as a raised step or threshold.  Trim any bushes or trees on the path to your home.  Use bright outdoor lighting.  Clear any walking paths of anything that might make someone trip, such as rocks or tools.  Regularly check to see if handrails are loose or broken. Make sure that both sides of any steps have handrails.  Any raised decks and porches should have guardrails on the edges.  Have any leaves, snow, or ice cleared regularly.  Use sand or salt on walking paths during winter.  Clean up any spills in your garage right away. This includes oil or grease spills. What can I do in the bathroom?  Use night  lights.  Install grab bars by the toilet and in the tub and shower. Do not use towel bars as grab bars.  Use non-skid mats or decals in the tub or shower.  If you need to sit down in the shower, use a plastic, non-slip stool.  Keep the floor dry. Clean up any water that spills on the floor as soon as it happens.  Remove soap buildup in the tub or shower regularly.  Attach bath mats securely with double-sided non-slip rug tape.  Do not have throw rugs and other things on the floor that can make you trip. What can I do in the bedroom?  Use night lights.  Make sure that you have a light by your bed that is easy to reach.  Do not use any sheets or blankets that are too big for your bed. They should not hang down onto the floor.  Have a firm chair that has side arms. You can use this for support while you get dressed.  Do not have throw rugs and other things on the floor that can make you trip. What can I do in the kitchen?  Clean up any spills right away.  Avoid walking  on wet floors.  Keep items that you use a lot in easy-to-reach places.  If you need to reach something above you, use a strong step stool that has a grab bar.  Keep electrical cords out of the way.  Do not use floor polish or wax that makes floors slippery. If you must use wax, use non-skid floor wax.  Do not have throw rugs and other things on the floor that can make you trip. What can I do with my stairs?  Do not leave any items on the stairs.  Make sure that there are handrails on both sides of the stairs and use them. Fix handrails that are broken or loose. Make sure that handrails are as long as the stairways.  Check any carpeting to make sure that it is firmly attached to the stairs. Fix any carpet that is loose or worn.  Avoid having throw rugs at the top or bottom of the stairs. If you do have throw rugs, attach them to the floor with carpet tape.  Make sure that you have a light switch at the  top of the stairs and the bottom of the stairs. If you do not have them, ask someone to add them for you. What else can I do to help prevent falls?  Wear shoes that:  Do not have high heels.  Have rubber bottoms.  Are comfortable and fit you well.  Are closed at the toe. Do not wear sandals.  If you use a stepladder:  Make sure that it is fully opened. Do not climb a closed stepladder.  Make sure that both sides of the stepladder are locked into place.  Ask someone to hold it for you, if possible.  Clearly mark and make sure that you can see:  Any grab bars or handrails.  First and last steps.  Where the edge of each step is.  Use tools that help you move around (mobility aids) if they are needed. These include:  Canes.  Walkers.  Scooters.  Crutches.  Turn on the lights when you go into a dark area. Replace any light bulbs as soon as they burn out.  Set up your furniture so you have a clear path. Avoid moving your furniture around.  If any of your floors are uneven, fix them.  If there are any pets around you, be aware of where they are.  Review your medicines with your doctor. Some medicines can make you feel dizzy. This can increase your chance of falling. Ask your doctor what other things that you can do to help prevent falls. This information is not intended to replace advice given to you by your health care provider. Make sure you discuss any questions you have with your health care provider. Document Released: 09/22/2009 Document Revised: 05/03/2016 Document Reviewed: 12/31/2014 Elsevier Interactive Patient Education  2017 [employer].

## 2021-02-17 ENCOUNTER — Telehealth: Payer: Self-pay | Admitting: Family Medicine

## 2021-02-17 NOTE — Telephone Encounter (Signed)
Patient is calling and stated that she has had a cough and some congestion and insisted on coming into the office. Offered patient a virtual but declined and wanted to ask the provider if she could come in. Patient has not had a Covid Test but stated that she has had all of her vaccines and has no other symptoms. Pt is requesting a call back. CB is [phone]

## 2021-02-17 NOTE — Telephone Encounter (Signed)
Called pt to discuss the below she stated she has been having a sinus headache that starts over her eyes and radiates to her head she declines temp and cough. Spoke to Dr. Elease Hashimoto he would like to have her scheduled to come into the office.

## 2021-02-20 ENCOUNTER — Ambulatory Visit (INDEPENDENT_AMBULATORY_CARE_PROVIDER_SITE_OTHER): Payer: Medicare HMO | Admitting: Family Medicine

## 2021-02-20 ENCOUNTER — Encounter: Payer: Self-pay | Admitting: Family Medicine

## 2021-02-20 ENCOUNTER — Other Ambulatory Visit: Payer: Self-pay

## 2021-02-20 VITALS — BP 110/80 | HR 77 | Temp 97.1°F | Ht 62.0 in | Wt 126.6 lb

## 2021-02-20 DIAGNOSIS — J019 Acute sinusitis, unspecified: Secondary | ICD-10-CM | POA: Diagnosis not present

## 2021-02-20 MED ORDER — DOXYCYCLINE HYCLATE 100 MG PO TABS: 100.0000 mg | ORAL_TABLET | Freq: Two times a day (BID) | ORAL | 0 refills | Status: DC

## 2021-02-20 NOTE — Progress Notes (Signed)
Established Patient Office Visit  Subjective:  Patient ID: Adriana Hill, female    DOB: 26-Oct-1946  Age: 75 y.o. MRN: [id number]  CC:  Chief Complaint  Patient presents with  . Headache    Patient complains of facial pain and headaches x2 weeks, tried Tylenol with some relief, denies Covid test being performed    HPI Adriana Hill presents for over 2-week history of sinusitis symptoms.  She has had significant facial pain maxillary and frontal bilaterally.  She actually felt worse last week when she called and was given this appointment.  Over the weekend she has felt somewhat better.  No fever.  No purulent discharge.  Occasional bloody nasal discharge.  Occasional headaches.  She has had similar symptoms with sinusitis in the past.  She had difficulty shaking these.  Denies any cough or dyspnea.  No nausea or vomiting.  She has intolerance to Augmentin which is diarrhea.  She has been prone to sinusitis in the past.  Past Medical History:  Diagnosis Date  . Allergy    hay fever  . Blood in stool   . Cancer [facility] 2004   breast  . Personal history of chemotherapy   . Personal history of radiation therapy     Past Surgical History:  Procedure Laterality Date  . MASTECTOMY Right 2004   right    Family History  Adopted: Yes  Problem Relation Age of Onset  . Breast cancer Neg Hx     Social History   Socioeconomic History  . Marital status: Single    Spouse name: Not on file  . Number of children: Not on file  . Years of education: Not on file  . Highest education level: Not on file  Occupational History  . Not on file  Tobacco Use  . Smoking status: Never Smoker  . Smokeless tobacco: Never Used  Vaping Use  . Vaping Use: Never used  Substance and Sexual Activity  . Alcohol use: Yes  . Drug use: Not on file  . Sexual activity: Not on file  Other Topics Concern  . Not on file  Social History Narrative  . Not on file   Social Determinants of Health    Financial Resource Strain: Low Risk   . Difficulty of Paying Living Expenses: Not hard at all  Food Insecurity: No Food Insecurity  . Worried About Charity fundraiser in the Last Year: Never true  . Ran Out of Food in the Last Year: Never true  Transportation Needs: No Transportation Needs  . Lack of Transportation (Medical): No  . Lack of Transportation (Non-Medical): No  Physical Activity: Inactive  . Days of Exercise per Week: 0 days  . Minutes of Exercise per Session: 0 min  Stress: No Stress Concern Present  . Feeling of Stress : Not at all  Social Connections: Moderately Integrated  . Frequency of Communication with Friends and Family: More than three times a week  . Frequency of Social Gatherings with Friends and Family: Once a week  . Attends Religious Services: More than 4 times per year  . Active Member of Clubs or Organizations: Yes  . Attends Archivist Meetings: More than 4 times per year  . Marital Status: Divorced  Human resources officer Violence: Not At Risk  . Fear of Current or Ex-Partner: No  . Emotionally Abused: No  . Physically Abused: No  . Sexually Abused: No    Outpatient Medications Prior to Visit  Medication  Sig Dispense Refill  . calcium-vitamin D (OSCAL WITH D) 500-200 MG-UNIT tablet Take 1 tablet by mouth.    . hydrocortisone cream 1 % Apply 1 application topically 2 (two) times daily as needed for itching.    . mupirocin ointment (BACTROBAN) 2 % Apply 1 application topically 2 (two) times daily. 22 g 1  . rosuvastatin (CRESTOR) 5 MG tablet TAKE 1 TABLET BY MOUTH AT BEDTIME. SCHEDULE LABS IN 2 MONTHS 30 tablet 2   No facility-administered medications prior to visit.    Allergies  Allergen Reactions  . Dicyclomine Hcl Palpitations    Patient had a reaction of tachycardia palpitations 30 minutes after taking medication  . Flagyl [Metronidazole] Anaphylaxis  . Augmentin [Amoxicillin-Pot Clavulanate] Diarrhea  . Theophyllines Rash     ROS Review of Systems  Constitutional: Negative for chills and fever.  HENT: Positive for congestion, sinus pressure and sinus pain.   Respiratory: Negative for cough and shortness of breath.       Objective:    Physical Exam Vitals reviewed.  Constitutional:      Appearance: She is well-developed.  Cardiovascular:     Rate and Rhythm: Normal rate and regular rhythm.  Pulmonary:     Effort: Pulmonary effort is normal.     Breath sounds: Normal breath sounds.  Musculoskeletal:     Cervical back: Neck supple. No rigidity.  Neurological:     Mental Status: She is alert.     BP 110/80 (BP Location: Left Arm, Patient Position: Sitting, Cuff Size: Normal)   Pulse 77   Temp (!) 97.1 F (36.2 C) (Axillary)   Ht 5\' 2"  (1.575 m)   Wt 126 lb 9.6 oz (57.4 kg)   BMI 23.16 kg/m  Wt Readings from Last 3 Encounters:  02/20/21 126 lb 9.6 oz (57.4 kg)  09/21/20 125 lb 1.6 oz (56.7 kg)  09/21/19 121 lb 3.2 oz (55 kg)     There are no preventive care reminders to display for this patient.  There are no preventive care reminders to display for this patient.  Lab Results  Component Value Date   TSH 2.25 09/21/2020   Lab Results  Component Value Date   WBC 7.1 09/21/2020   HGB 12.4 09/21/2020   HCT 37.9 09/21/2020   MCV 94.5 09/21/2020   PLT 229 09/21/2020   Lab Results  Component Value Date   NA 138 09/21/2020   K 4.7 09/21/2020   CHLORIDE 104 09/28/2013   CO2 27 09/21/2020   GLUCOSE 98 09/21/2020   BUN 15 09/21/2020   CREATININE 0.89 09/21/2020   BILITOT 0.6 11/23/2020   ALKPHOS 62 09/21/2019   AST 20 11/23/2020   ALT 13 11/23/2020   PROT 6.6 11/23/2020   ALBUMIN 3.9 09/21/2019   CALCIUM 9.0 09/21/2020   ANIONGAP 7 02/11/2017   GFR 64.10 09/21/2019   Lab Results  Component Value Date   CHOL 167 11/23/2020   Lab Results  Component Value Date   HDL 62 11/23/2020   Lab Results  Component Value Date   LDLCALC 86 11/23/2020   Lab Results  Component  Value Date   TRIG 92 11/23/2020   Lab Results  Component Value Date   CHOLHDL 2.7 11/23/2020   No results found for: HGBA1C    Assessment & Plan:   Problem List Items Addressed This Visit   None   Visit Diagnoses    Acute non-recurrent sinusitis, unspecified location    -  Primary   Relevant Medications  doxycycline (VIBRA-TABS) 100 MG tablet    Her symptoms are slightly improved today and we recommended giving this a couple more days with observation.  If she has any recurrent purulent secretions, increased facial pain or other worsening symptoms start doxycycline 100 mg twice daily with food for 10 days We discussed other measures to help facilitate drainage with moist heat, good hydration, hot showers, etc.  Meds ordered this encounter  Medications  . doxycycline (VIBRA-TABS) 100 MG tablet    Sig: Take 1 tablet (100 mg total) by mouth 2 (two) times daily.    Dispense:  20 tablet    Refill:  0    Follow-up: No follow-ups on file.    Carolann Littler, MD

## 2021-02-20 NOTE — Patient Instructions (Signed)
Sinusitis, Adult Sinusitis is inflammation of your sinuses. Sinuses are hollow spaces in the bones around your face. Your sinuses are located:  Around your eyes.  In the middle of your forehead.  Behind your nose.  In your cheekbones. Mucus normally drains out of your sinuses. When your nasal tissues become inflamed or swollen, mucus can become trapped or blocked. This allows bacteria, viruses, and fungi to grow, which leads to infection. Most infections of the sinuses are caused by a virus. Sinusitis can develop quickly. It can last for up to 4 weeks (acute) or for more than 12 weeks (chronic). Sinusitis often develops after a cold. What are the causes? This condition is caused by anything that creates swelling in the sinuses or stops mucus from draining. This includes:  Allergies.  Asthma.  Infection from bacteria or viruses.  Deformities or blockages in your nose or sinuses.  Abnormal growths in the nose (nasal polyps).  Pollutants, such as chemicals or irritants in the air.  Infection from fungi (rare). What increases the risk? You are more likely to develop this condition if you:  Have a weak body defense system (immune system).  Do a lot of swimming or diving.  Overuse nasal sprays.  Smoke. What are the signs or symptoms? The main symptoms of this condition are pain and a feeling of pressure around the affected sinuses. Other symptoms include:  Stuffy nose or congestion.  Thick drainage from your nose.  Swelling and warmth over the affected sinuses.  Headache.  Upper toothache.  A cough that may get worse at night.  Extra mucus that collects in the throat or the back of the nose (postnasal drip).  Decreased sense of smell and taste.  Fatigue.  A fever.  Sore throat.  Bad breath. How is this diagnosed? This condition is diagnosed based on:  Your symptoms.  Your medical history.  A physical exam.  Tests to find out if your condition is  acute or chronic. This may include: ? Checking your nose for nasal polyps. ? Viewing your sinuses using a device that has a light (endoscope). ? Testing for allergies or bacteria. ? Imaging tests, such as an MRI or CT scan. In rare cases, a bone biopsy may be done to rule out more serious types of fungal sinus disease. How is this treated? Treatment for sinusitis depends on the cause and whether your condition is chronic or acute.  If caused by a virus, your symptoms should go away on their own within 10 days. You may be given medicines to relieve symptoms. They include: ? Medicines that shrink swollen nasal passages (topical intranasal decongestants). ? Medicines that treat allergies (antihistamines). ? A spray that eases inflammation of the nostrils (topical intranasal corticosteroids). ? Rinses that help get rid of thick mucus in your nose (nasal saline washes).  If caused by bacteria, your health care provider may recommend waiting to see if your symptoms improve. Most bacterial infections will get better without antibiotic medicine. You may be given antibiotics if you have: ? A severe infection. ? A weak immune system.  If caused by narrow nasal passages or nasal polyps, you may need to have surgery. Follow these instructions at home: Medicines  Take, use, or apply over-the-counter and prescription medicines only as told by your health care provider. These may include nasal sprays.  If you were prescribed an antibiotic medicine, take it as told by your health care provider. Do not stop taking the antibiotic even if you start   to feel better. Hydrate and humidify  Drink enough fluid to keep your urine pale yellow. Staying hydrated will help to thin your mucus.  Use a cool mist humidifier to keep the humidity level in your home above 50%.  Inhale steam for 10-15 minutes, 3-4 times a day, or as told by your health care provider. You can do this in the bathroom while a hot shower is  running.  Limit your exposure to cool or dry air.   Rest  Rest as much as possible.  Sleep with your head raised (elevated).  Make sure you get enough sleep each night. General instructions  Apply a warm, moist washcloth to your face 3-4 times a day or as told by your health care provider. This will help with discomfort.  Wash your hands often with soap and water to reduce your exposure to germs. If soap and water are not available, use hand sanitizer.  Do not smoke. Avoid being around people who are smoking (secondhand smoke).  Keep all follow-up visits as told by your health care provider. This is important.   Contact a health care provider if:  You have a fever.  Your symptoms get worse.  Your symptoms do not improve within 10 days. Get help right away if:  You have a severe headache.  You have persistent vomiting.  You have severe pain or swelling around your face or eyes.  You have vision problems.  You develop confusion.  Your neck is stiff.  You have trouble breathing. Summary  Sinusitis is soreness and inflammation of your sinuses. Sinuses are hollow spaces in the bones around your face.  This condition is caused by nasal tissues that become inflamed or swollen. The swelling traps or blocks the flow of mucus. This allows bacteria, viruses, and fungi to grow, which leads to infection.  If you were prescribed an antibiotic medicine, take it as told by your health care provider. Do not stop taking the antibiotic even if you start to feel better.  Keep all follow-up visits as told by your health care provider. This is important. This information is not intended to replace advice given to you by your health care provider. Make sure you discuss any questions you have with your health care provider. Document Revised: 04/28/2018 Document Reviewed: 04/28/2018 Elsevier Patient Education  2021 Elsevier Inc.  

## 2021-03-08 DIAGNOSIS — H5315 Visual distortions of shape and size: Secondary | ICD-10-CM | POA: Diagnosis not present

## 2021-03-08 DIAGNOSIS — H5319 Other subjective visual disturbances: Secondary | ICD-10-CM | POA: Diagnosis not present

## 2021-03-08 DIAGNOSIS — H04123 Dry eye syndrome of bilateral lacrimal glands: Secondary | ICD-10-CM | POA: Diagnosis not present

## 2021-03-08 DIAGNOSIS — H2513 Age-related nuclear cataract, bilateral: Secondary | ICD-10-CM | POA: Diagnosis not present

## 2021-03-30 ENCOUNTER — Other Ambulatory Visit: Payer: Self-pay | Admitting: Family Medicine

## 2021-03-30 DIAGNOSIS — E785 Hyperlipidemia, unspecified: Secondary | ICD-10-CM

## 2021-06-06 ENCOUNTER — Other Ambulatory Visit: Payer: Self-pay

## 2021-06-07 ENCOUNTER — Encounter: Payer: Self-pay | Admitting: Family Medicine

## 2021-06-07 ENCOUNTER — Ambulatory Visit (INDEPENDENT_AMBULATORY_CARE_PROVIDER_SITE_OTHER): Payer: Medicare HMO | Admitting: Family Medicine

## 2021-06-07 VITALS — BP 126/70 | HR 97 | Temp 98.3°F | Wt 121.9 lb

## 2021-06-07 DIAGNOSIS — L509 Urticaria, unspecified: Secondary | ICD-10-CM

## 2021-06-07 NOTE — Patient Instructions (Signed)
Consider OTC anti-histamine such as Zyrtec  Could also add Pepcid or Zantac.

## 2021-06-07 NOTE — Progress Notes (Signed)
Established Patient Office Visit  Subjective:  Patient ID: Adriana Hill, female    DOB: 1946-09-22  Age: 75 y.o. MRN: [id number]  CC:  Chief Complaint  Patient presents with   Rash    Back of L arm, x 1 day, no discomfort, thinks it could be shingles    HPI  [name] presents for 1 day history of rash posterior aspect of left arm.  She was concerned whether this may be shingles.  No associated pain.  No vesicles.  She actually has somewhat similar rash on the inner posterior aspect of the right arm as well.  Denies any change of deodorants.  No systemic rash.  No known food allergies.  No recent change of soap or detergent.  No fevers or chills.  Generally feels well.  Has not had shingles vaccine previously  Past Medical History:  Diagnosis Date   Allergy    hay fever   Blood in stool    Cancer ([place]) 2004   breast   Personal history of chemotherapy    Personal history of radiation therapy     Past Surgical History:  Procedure Laterality Date   MASTECTOMY Right 2004   right    Family History  Adopted: Yes  Problem Relation Age of Onset   Breast cancer Neg Hx     Social History   Socioeconomic History   Marital status: Single    Spouse name: Not on file   Number of children: Not on file   Years of education: Not on file   Highest education level: Not on file  Occupational History   Not on file  Tobacco Use   Smoking status: Never   Smokeless tobacco: Never  Vaping Use   Vaping Use: Never used  Substance and Sexual Activity   Alcohol use: Yes   Drug use: Not on file   Sexual activity: Not on file  Other Topics Concern   Not on file  Social History Narrative   Not on file   Social Determinants of Health   Financial Resource Strain: Low Risk    Difficulty of Paying Living Expenses: Not hard at all  Food Insecurity: No Food Insecurity   Worried About Running Out of Food in the Last Year: Never true   Ran Out of Food in the Last Year: Never true   Transportation Needs: No Transportation Needs   Lack of Transportation (Medical): No   Lack of Transportation (Non-Medical): No  Physical Activity: Inactive   Days of Exercise per Week: 0 days   Minutes of Exercise per Session: 0 min  Stress: No Stress Concern Present   Feeling of Stress : Not at all  Social Connections: Moderately Integrated   Frequency of Communication with Friends and Family: More than three times a week   Frequency of Social Gatherings with Friends and Family: Once a week   Attends Religious Services: More than 4 times per year   Active Member of [employer] or Organizations: Yes   Attends Music therapist: More than 4 times per year   Marital Status: Divorced  Human resources officer Violence: Not At Risk   Fear of Current or Ex-Partner: No   Emotionally Abused: No   Physically Abused: No   Sexually Abused: No    Outpatient Medications Prior to Visit  Medication Sig Dispense Refill   calcium-vitamin D (OSCAL WITH D) 500-200 MG-UNIT tablet Take 1 tablet by mouth.     doxycycline (VIBRA-TABS) 100 MG  tablet Take 1 tablet (100 mg total) by mouth 2 (two) times daily. 20 tablet 0   hydrocortisone cream 1 % Apply 1 application topically 2 (two) times daily as needed for itching.     mupirocin ointment (BACTROBAN) 2 % Apply 1 application topically 2 (two) times daily. 22 g 1   rosuvastatin (CRESTOR) 5 MG tablet TAKE 1 TABLET BY MOUTH AT BEDTIME. SCHEDULE LABS IN 2 MONTHS 30 tablet 2   No facility-administered medications prior to visit.    Allergies  Allergen Reactions   Dicyclomine Hcl Palpitations    Patient had a reaction of tachycardia palpitations 30 minutes after taking medication   Flagyl [Metronidazole] Anaphylaxis   Augmentin [Amoxicillin-Pot Clavulanate] Diarrhea   Theophyllines Rash    ROS Review of Systems  Constitutional:  Negative for chills and fever.  Skin:  Positive for rash.     Objective:    Physical Exam Vitals reviewed.   Constitutional:      Appearance: Normal appearance.  Cardiovascular:     Rate and Rhythm: Normal rate and regular rhythm.  Skin:    Findings: Rash present.     Comments: Minimally raised erythematous nonscaly blanching rash on her left and right arm posteriorly and medially.  She has some similar rash extending into the left axillary region.  Did not see any involvement of the neck or trunk otherwise.  No vesicles  Neurological:     Mental Status: She is alert.    BP 126/70 (BP Location: Left Arm, Patient Position: Sitting, Cuff Size: Normal)   Pulse 97   Temp 98.3 F (36.8 C) (Oral)   Wt 121 lb 14.4 oz (55.3 kg)   SpO2 97%   BMI 22.30 kg/m  Wt Readings from Last 3 Encounters:  06/07/21 121 lb 14.4 oz (55.3 kg)  02/20/21 126 lb 9.6 oz (57.4 kg)  09/21/20 125 lb 1.6 oz (56.7 kg)     Health Maintenance Due  Topic Date Due   Zoster Vaccines- Shingrix (1 of 2) Never done   COVID-19 Vaccine (3 - Pfizer risk series) 03/08/2020    There are no preventive care reminders to display for this patient.  Lab Results  Component Value Date   TSH 2.25 09/21/2020   Lab Results  Component Value Date   WBC 7.1 09/21/2020   HGB 12.4 09/21/2020   HCT 37.9 09/21/2020   MCV 94.5 09/21/2020   PLT 229 09/21/2020   Lab Results  Component Value Date   NA 138 09/21/2020   K 4.7 09/21/2020   CHLORIDE 104 09/28/2013   CO2 27 09/21/2020   GLUCOSE 98 09/21/2020   BUN 15 09/21/2020   CREATININE 0.89 09/21/2020   BILITOT 0.6 11/23/2020   ALKPHOS 62 09/21/2019   AST 20 11/23/2020   ALT 13 11/23/2020   PROT 6.6 11/23/2020   ALBUMIN 3.9 09/21/2019   CALCIUM 9.0 09/21/2020   ANIONGAP 7 02/11/2017   GFR 64.10 09/21/2019   Lab Results  Component Value Date   CHOL 167 11/23/2020   Lab Results  Component Value Date   HDL 62 11/23/2020   Lab Results  Component Value Date   LDLCALC 86 11/23/2020   Lab Results  Component Value Date   TRIG 92 11/23/2020   Lab Results  Component  Value Date   CHOLHDL 2.7 11/23/2020   No results found for: HGBA1C    Assessment & Plan:   Urticarial rash involving upper arms bilaterally.  No evidence to suggest shingles.  Etiology unclear. -  We discussed possible triggers for urticarial rashes -We discussed the fact that he can exacerbate -Recommend over-the-counter antihistamine such as Zyrtec 1 daily -Could add Pepcid or Zantac as H2 blocker as well.  Follow-up for any persistent or worsening rash.  No orders of the defined types were placed in this encounter.   Follow-up: No follow-ups on file.    Carolann Littler, MD

## 2021-06-19 DIAGNOSIS — C50911 Malignant neoplasm of unspecified site of right female breast: Secondary | ICD-10-CM | POA: Diagnosis not present

## 2021-07-13 ENCOUNTER — Other Ambulatory Visit: Payer: Self-pay | Admitting: Family Medicine

## 2021-07-13 DIAGNOSIS — E785 Hyperlipidemia, unspecified: Secondary | ICD-10-CM

## 2021-07-14 ENCOUNTER — Other Ambulatory Visit: Payer: Self-pay | Admitting: Obstetrics and Gynecology

## 2021-07-14 DIAGNOSIS — Z1231 Encounter for screening mammogram for malignant neoplasm of breast: Secondary | ICD-10-CM

## 2021-08-12 ENCOUNTER — Other Ambulatory Visit: Payer: Self-pay | Admitting: Family Medicine

## 2021-08-12 DIAGNOSIS — E785 Hyperlipidemia, unspecified: Secondary | ICD-10-CM

## 2021-08-25 ENCOUNTER — Telehealth: Payer: Self-pay

## 2021-08-25 DIAGNOSIS — E785 Hyperlipidemia, unspecified: Secondary | ICD-10-CM

## 2021-08-25 MED ORDER — ROSUVASTATIN CALCIUM 5 MG PO TABS: ORAL_TABLET | ORAL | 0 refills | Status: DC

## 2021-08-25 NOTE — Telephone Encounter (Signed)
Patient called requesting Rx refill on rosuvastatin (CRESTOR) 5 MG tablet Pt has Cpe scheduled for 10/24

## 2021-08-25 NOTE — Telephone Encounter (Signed)
Rx has been sent in. 

## 2021-08-25 NOTE — Addendum Note (Signed)
Addended by: Rebecca Eaton on: 08/25/2021 10:13 AM   Modules accepted: Orders

## 2021-09-02 IMAGING — MG DIGITAL SCREENING UNILAT LEFT W/ TOMO W/ CAD
4 series · 4 of 12 positions shown · non-contrast
Comparison: Previous exam(s).

CLINICAL DATA: Screening.

EXAM:
DIGITAL SCREENING UNILATERAL LEFT MAMMOGRAM WITH CAD AND TOMO

[L MLO synth-2D]
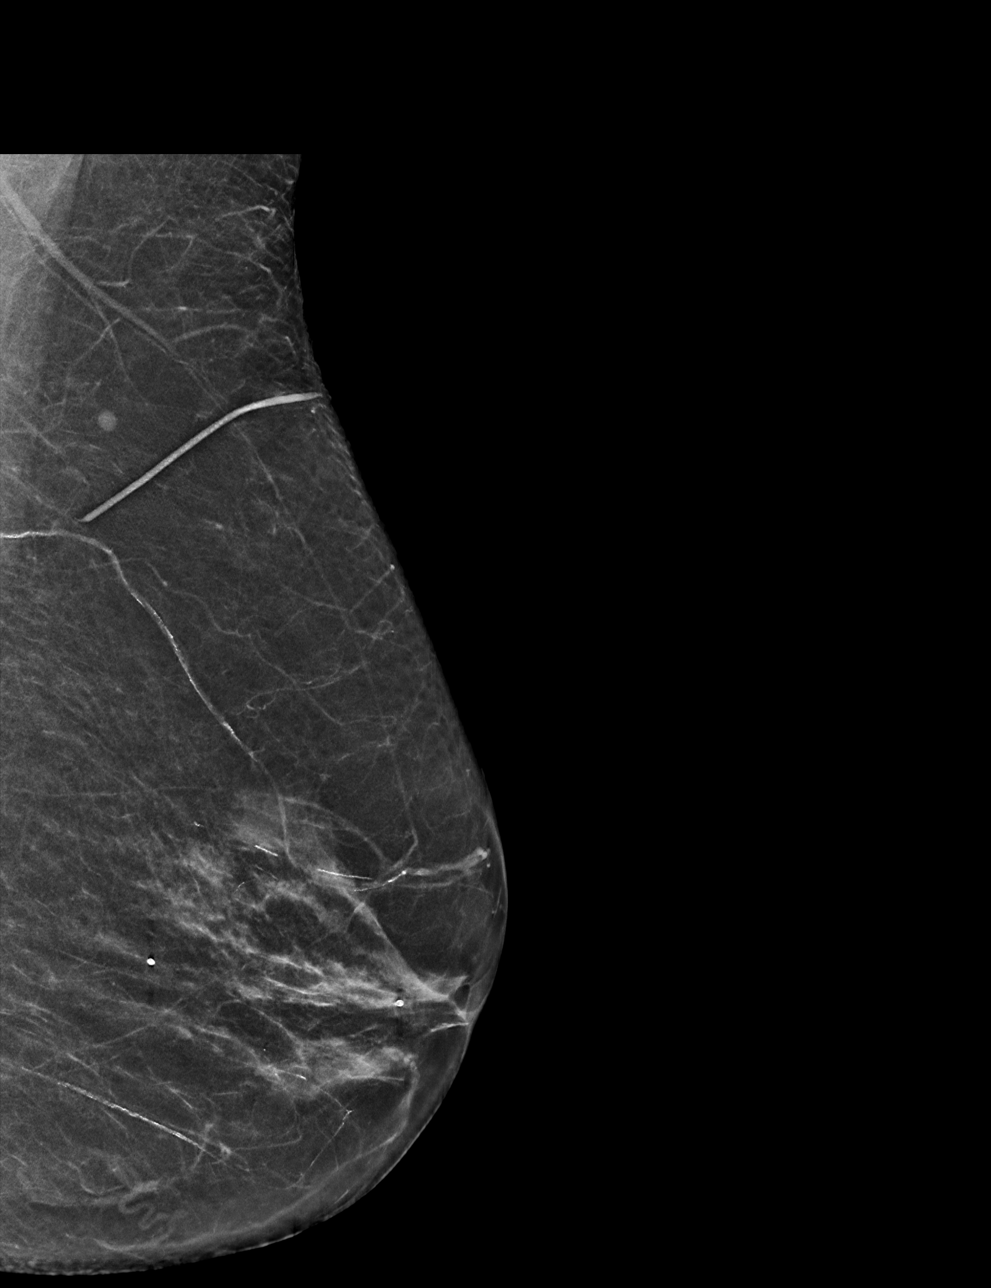

[L CC synth-2D]
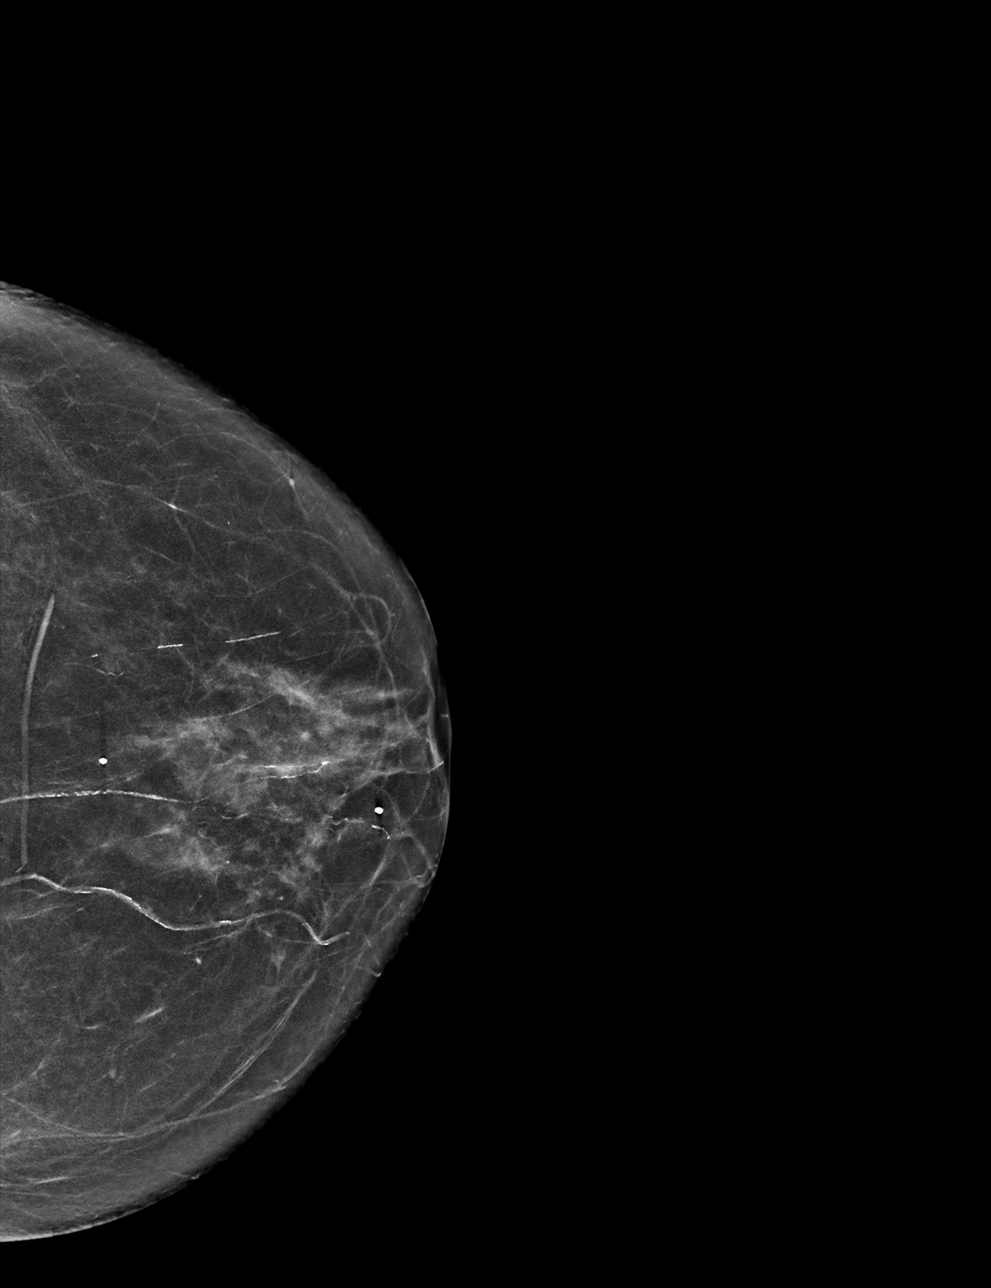

[L MLO tomo · tomo slice 27/54.0]
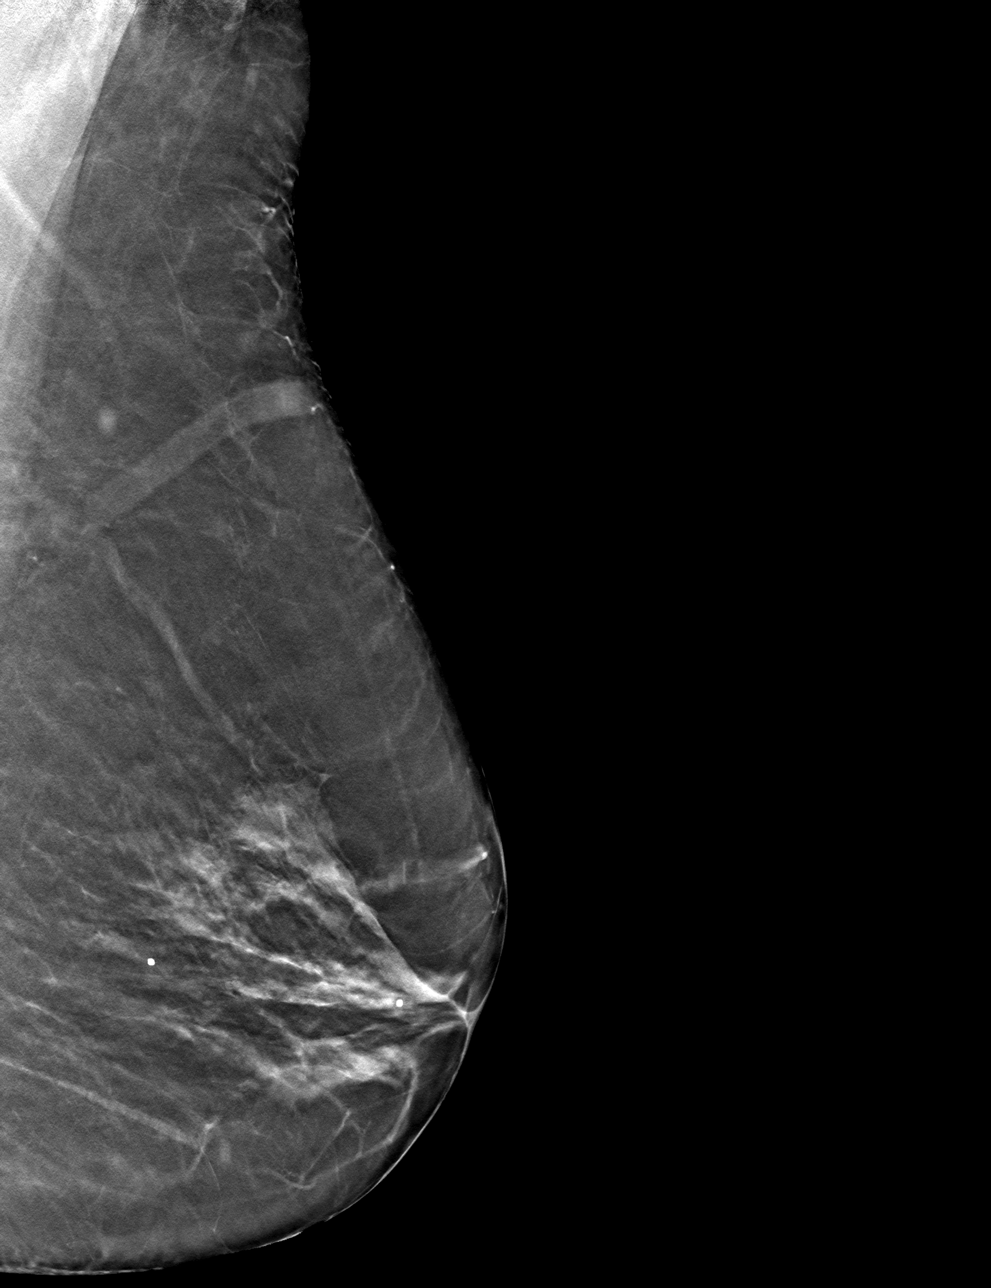

[L CC tomo · tomo slice 27/54.0]
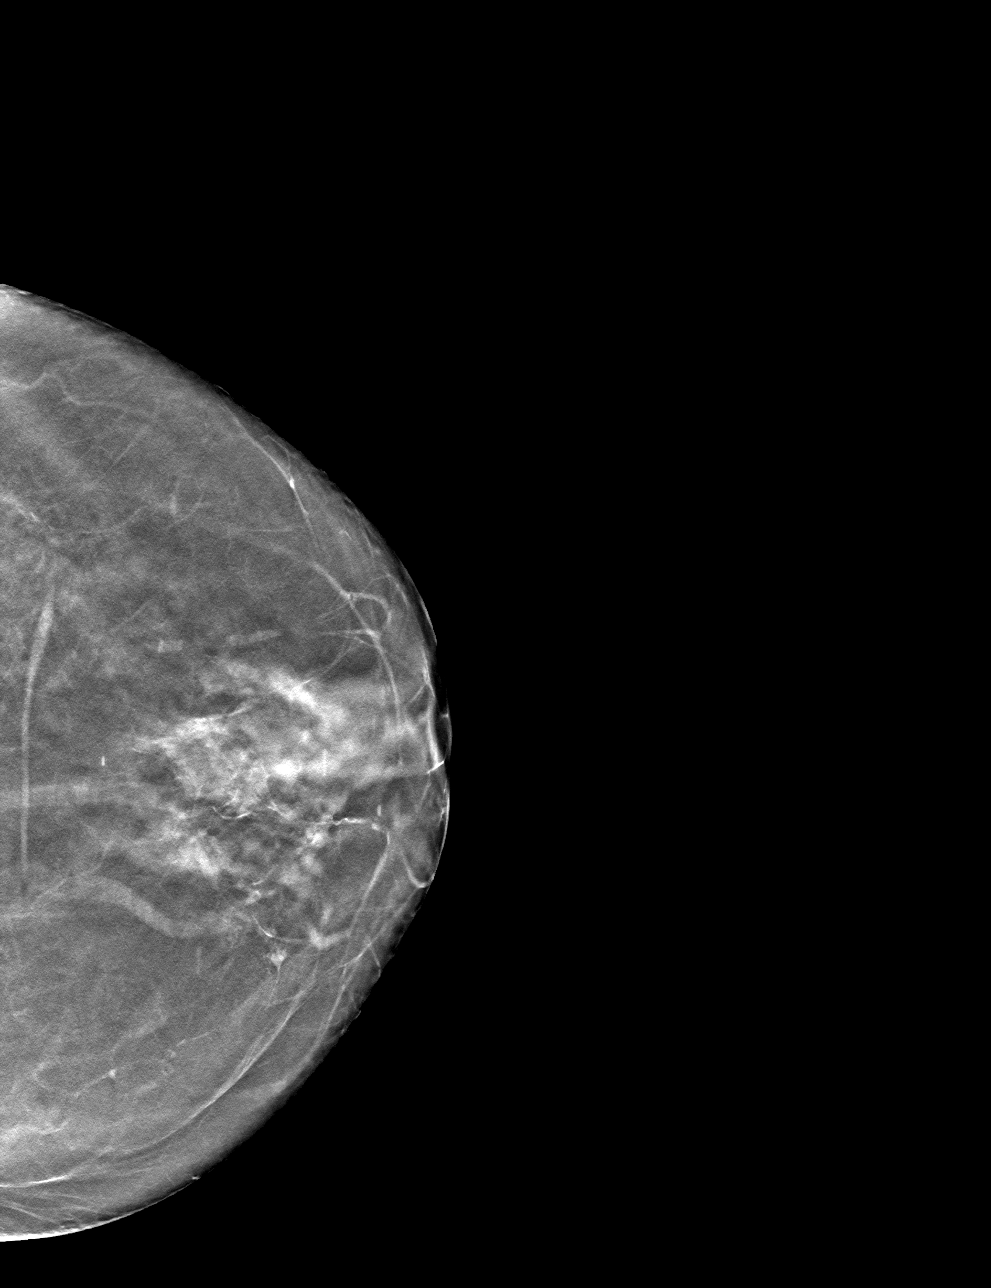

[4 of 12 positions shown; findings below may reference images not displayed]

ACR Breast Density Category c: The breast tissue is heterogeneously
dense, which may obscure small masses.
FINDINGS: There are no findings suspicious for malignancy. Images were
processed with CAD.
IMPRESSION: No mammographic evidence of malignancy. A result letter of this
screening mammogram will be mailed directly to the patient.

RECOMMENDATION:
Screening mammogram in one year. (Code:YR-2-EWS)

BI-RADS CATEGORY  1: Negative.

## 2021-09-04 ENCOUNTER — Other Ambulatory Visit: Payer: Self-pay

## 2021-09-04 ENCOUNTER — Ambulatory Visit
Admission: RE | Admit: 2021-09-04 | Discharge: 2021-09-04 | Disposition: A | Discharge status: Home / Self Care | Payer: Medicare HMO | Source: Ambulatory Visit | Attending: Obstetrics and Gynecology | Admitting: Obstetrics and Gynecology

## 2021-09-04 DIAGNOSIS — Z1231 Encounter for screening mammogram for malignant neoplasm of breast: Secondary | ICD-10-CM

## 2021-09-14 DIAGNOSIS — Z01419 Encounter for gynecological examination (general) (routine) without abnormal findings: Secondary | ICD-10-CM | POA: Diagnosis not present

## 2021-09-14 NOTE — Telephone Encounter (Signed)
Pt confirmed that she picked up medication from pharmacy on 08/25/21. Pt encouraged to keep upcoming 10/24 appt to be sure her medications are refilled. Pt verb understanding.

## 2021-09-15 ENCOUNTER — Other Ambulatory Visit: Payer: Self-pay | Admitting: Family Medicine

## 2021-09-15 DIAGNOSIS — E785 Hyperlipidemia, unspecified: Secondary | ICD-10-CM

## 2021-10-02 ENCOUNTER — Other Ambulatory Visit: Payer: Self-pay

## 2021-10-02 ENCOUNTER — Ambulatory Visit (INDEPENDENT_AMBULATORY_CARE_PROVIDER_SITE_OTHER): Payer: Medicare HMO | Admitting: Family Medicine

## 2021-10-02 VITALS — BP 124/72 | HR 105 | Temp 98.1°F | Ht 62.0 in | Wt 122.9 lb

## 2021-10-02 DIAGNOSIS — Z0000 Encounter for general adult medical examination without abnormal findings: Secondary | ICD-10-CM

## 2021-10-02 DIAGNOSIS — Z23 Encounter for immunization: Secondary | ICD-10-CM

## 2021-10-02 LAB — CBC WITH DIFFERENTIAL/PLATELET
Basophils Absolute: 0 10*3/uL (ref 0.0–0.1)
Basophils Relative: 0.6 % (ref 0.0–3.0)
Eosinophils Absolute: 0.2 10*3/uL (ref 0.0–0.7)
Eosinophils Relative: 4 % (ref 0.0–5.0)
HCT: 38.1 % (ref 36.0–46.0)
Hemoglobin: 12.4 g/dL (ref 12.0–15.0)
Lymphocytes Relative: 35.4 % (ref 12.0–46.0)
Lymphs Abs: 2.1 10*3/uL (ref 0.7–4.0)
MCHC: 32.5 g/dL (ref 30.0–36.0)
MCV: 93.8 fl (ref 78.0–100.0)
Monocytes Absolute: 0.6 10*3/uL (ref 0.1–1.0)
Monocytes Relative: 10.9 % (ref 3.0–12.0)
Neutro Abs: 2.9 10*3/uL (ref 1.4–7.7)
Neutrophils Relative %: 49.1 % (ref 43.0–77.0)
Platelets: 267 10*3/uL (ref 150.0–400.0)
RBC: 4.07 Mil/uL (ref 3.87–5.11)
RDW: 13.6 % (ref 11.5–15.5)
WBC: 5.9 10*3/uL (ref 4.0–10.5)

## 2021-10-02 LAB — LIPID PANEL
Cholesterol: 167 mg/dL (ref 0–200)
HDL: 62.3 mg/dL (ref >39.00)
LDL Cholesterol: 89 mg/dL (ref 0–99)
NonHDL: 104.6
Total CHOL/HDL Ratio: 3
Triglycerides: 79 mg/dL (ref 0.0–149.0)
VLDL: 15.8 mg/dL (ref 0.0–40.0)

## 2021-10-02 LAB — BASIC METABOLIC PANEL
BUN: 15 mg/dL (ref 6–23)
CO2: 29 mEq/L (ref 19–32)
Calcium: 9.5 mg/dL (ref 8.4–10.5)
Chloride: 101 mEq/L (ref 96–112)
Creatinine, Ser: 0.94 mg/dL (ref 0.40–1.20)
GFR: 60.21 mL/min (ref >60.00)
Glucose, Bld: 99 mg/dL (ref 70–99)
Potassium: 4.2 mEq/L (ref 3.5–5.1)
Sodium: 137 mEq/L (ref 135–145)

## 2021-10-02 LAB — HEPATIC FUNCTION PANEL
ALT: 15 U/L (ref 0–35)
AST: 22 U/L (ref 0–37)
Albumin: 4 g/dL (ref 3.5–5.2)
Alkaline Phosphatase: 59 U/L (ref 39–117)
Bilirubin, Direct: 0.1 mg/dL (ref 0.0–0.3)
Total Bilirubin: 0.6 mg/dL (ref 0.2–1.2)
Total Protein: 7.6 g/dL (ref 6.0–8.3)

## 2021-10-02 LAB — TSH: TSH: 4.16 u[IU]/mL (ref 0.35–5.50)

## 2021-10-02 NOTE — Patient Instructions (Signed)
Consider Shingrix vaccine at some point this year.  

## 2021-10-02 NOTE — Addendum Note (Signed)
Addended by: Amanda Cockayne on: 10/02/2021 10:15 AM   Modules accepted: Orders

## 2021-10-02 NOTE — Progress Notes (Signed)
Established Patient Office Visit  Subjective:  Patient ID: Adriana Hill, female    DOB: 25-Jun-1946  Age: 75 y.o. MRN: [id number]  CC:  Chief Complaint  Patient presents with   Annual Exam    No new concerns     HPI Adriana Hill presents for annual physical exam.  She has past history of breast cancer.  She gets yearly mammograms.  She has osteoporosis.  Previous radiation therapy and she declines any osteoporosis therapies.  -Still needs flu vaccine -COVID vaccines up-to-date -Pneumonia vaccines complete -Previous hepatitis C screening negative -Getting yearly mammograms -Cologuard last year negative. 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  No history of smoking.  No regular alcohol.  She has 1 daughter and 2 grandchildren.  She had a child that died age 75 of cerebral palsy complications.  She is preparing to move in December to a 66 and older community down in [place] to be closer to her daughter and grandchildren.  Past Medical History:  Diagnosis Date   Allergy    hay fever   Blood in stool    Cancer ([place]) 2004   breast   Personal history of chemotherapy    Personal history of radiation therapy     Past Surgical History:  Procedure Laterality Date   MASTECTOMY Right 2004   right    Family History  Adopted: Yes  Problem Relation Age of Onset   Breast cancer Neg Hx     Social History   Socioeconomic History   Marital status: Single    Spouse name: Not on file   Number of children: Not on file   Years of education: Not on file   Highest education level: Not on file  Occupational History   Not on file  Tobacco Use   Smoking status: Never   Smokeless tobacco: Never  Vaping Use   Vaping Use: Never used  Substance and Sexual Activity   Alcohol use: Yes   Drug use: Not on file   Sexual activity: Not on file  Other Topics Concern   Not on file   Social History Narrative   Not on file   Social Determinants of Health   Financial Resource Strain: Low Risk    Difficulty of Paying Living Expenses: Not hard at all  Food Insecurity: No Food Insecurity   Worried About Running Out of Food in the Last Year: Never true   Ran Out of Food in the Last Year: Never true  Transportation Needs: No Transportation Needs   Lack of Transportation (Medical): No   Lack of Transportation (Non-Medical): No  Physical Activity: Inactive   Days of Exercise per Week: 0 days   Minutes of Exercise per Session: 0 min  Stress: No Stress Concern Present   Feeling of Stress : Not at all  Social Connections: Moderately Integrated   Frequency of Communication with Friends and Family: More than three times a week   Frequency of Social Gatherings with Friends and Family: Once a week   Attends Religious Services: More than 4 times per year   Active Member of [employer] or Organizations: Yes   Attends Music therapist: More than 4 times per year   Marital Status: Divorced  Human resources officer Violence: Not At Risk   Fear of Current or Ex-Partner: No   Emotionally Abused: No   Physically Abused: No   Sexually Abused: No  Outpatient Medications Prior to Visit  Medication Sig Dispense Refill   calcium-vitamin D (OSCAL WITH D) 500-200 MG-UNIT tablet Take 1 tablet by mouth.     hydrocortisone cream 1 % Apply 1 application topically 2 (two) times daily as needed for itching.     mupirocin ointment (BACTROBAN) 2 % Apply 1 application topically 2 (two) times daily. 22 g 1   rosuvastatin (CRESTOR) 5 MG tablet TAKE 1 TABLET BY MOUTH AT BEDTIME. SCHEDULE LABS IN 2 MONTHS 30 tablet 0   doxycycline (VIBRA-TABS) 100 MG tablet Take 1 tablet (100 mg total) by mouth 2 (two) times daily. 20 tablet 0   No facility-administered medications prior to visit.    Allergies  Allergen Reactions   Dicyclomine Hcl Palpitations    Patient had a reaction of tachycardia  palpitations 30 minutes after taking medication   Flagyl [Metronidazole] Anaphylaxis   Augmentin [Amoxicillin-Pot Clavulanate] Diarrhea   Theophyllines Rash    ROS Review of Systems  Constitutional:  Negative for activity change, appetite change, fatigue, fever and unexpected weight change.  HENT:  Negative for ear pain, hearing loss, sore throat and trouble swallowing.   Eyes:  Negative for visual disturbance.  Respiratory:  Negative for cough and shortness of breath.   Cardiovascular:  Negative for chest pain and palpitations.  Gastrointestinal:  Negative for abdominal pain, blood in stool, constipation and diarrhea.  Endocrine: Negative for polydipsia and polyuria.  Genitourinary:  Negative for dysuria and hematuria.  Musculoskeletal:  Negative for arthralgias, back pain and myalgias.  Skin:  Negative for rash.  Neurological:  Negative for dizziness, syncope and headaches.  Hematological:  Negative for adenopathy.  Psychiatric/Behavioral:  Negative for confusion and dysphoric mood.      Objective:    Physical Exam Constitutional:      Appearance: She is well-developed.  HENT:     Head: Normocephalic and atraumatic.  Eyes:     Pupils: Pupils are equal, round, and reactive to light.  Neck:     Thyroid: No thyromegaly.  Cardiovascular:     Rate and Rhythm: Normal rate and regular rhythm.     Heart sounds: Normal heart sounds. No murmur heard. Pulmonary:     Effort: No respiratory distress.     Breath sounds: Normal breath sounds. No wheezing or rales.  Abdominal:     General: Bowel sounds are normal. There is no distension.     Palpations: Abdomen is soft. There is no mass.     Tenderness: There is no abdominal tenderness. There is no guarding or rebound.  Musculoskeletal:        General: Normal range of motion.     Cervical back: Normal range of motion and neck supple.     Right lower leg: No edema.     Left lower leg: No edema.  Lymphadenopathy:     Cervical: No  cervical adenopathy.  Skin:    Findings: No rash.  Neurological:     Mental Status: She is alert and oriented to person, place, and time.     Cranial Nerves: No cranial nerve deficit.  Psychiatric:        Behavior: Behavior normal.        Thought Content: Thought content normal.        Judgment: Judgment normal.    BP 124/72 (BP Location: Left Arm, Patient Position: Sitting, Cuff Size: Normal)   Pulse (!) 105   Temp 98.1 F (36.7 C) (Oral)   Ht 5\' 2"  (1.575 m)  Wt 122 lb 14.4 oz (55.7 kg)   SpO2 98%   BMI 22.48 kg/m  Wt Readings from Last 3 Encounters:  10/02/21 122 lb 14.4 oz (55.7 kg)  06/07/21 121 lb 14.4 oz (55.3 kg)  02/20/21 126 lb 9.6 oz (57.4 kg)     Health Maintenance Due  Topic Date Due   TETANUS/TDAP  Never done   COVID-19 Vaccine (3 - Pfizer risk series) 03/08/2020   INFLUENZA VACCINE  07/10/2021    There are no preventive care reminders to display for this patient.  Lab Results  Component Value Date   TSH 2.25 09/21/2020   Lab Results  Component Value Date   WBC 7.1 09/21/2020   HGB 12.4 09/21/2020   HCT 37.9 09/21/2020   MCV 94.5 09/21/2020   PLT 229 09/21/2020   Lab Results  Component Value Date   NA 138 09/21/2020   K 4.7 09/21/2020   CHLORIDE 104 09/28/2013   CO2 27 09/21/2020   GLUCOSE 98 09/21/2020   BUN 15 09/21/2020   CREATININE 0.89 09/21/2020   BILITOT 0.6 11/23/2020   ALKPHOS 62 09/21/2019   AST 20 11/23/2020   ALT 13 11/23/2020   PROT 6.6 11/23/2020   ALBUMIN 3.9 09/21/2019   CALCIUM 9.0 09/21/2020   ANIONGAP 7 02/11/2017   GFR 64.10 09/21/2019   Lab Results  Component Value Date   CHOL 167 11/23/2020   Lab Results  Component Value Date   HDL 62 11/23/2020   Lab Results  Component Value Date   LDLCALC 86 11/23/2020   Lab Results  Component Value Date   TRIG 92 11/23/2020   Lab Results  Component Value Date   CHOLHDL 2.7 11/23/2020   No results found for: HGBA1C    Assessment & Plan:   Problem List  Items Addressed This Visit   None Visit Diagnoses     Physical exam    -  Primary   Relevant Orders   Basic metabolic panel   Lipid panel   CBC with Differential/Platelet   TSH   Hepatic function panel     -Flu vaccine given -Encouraged her to consider Shingrix vaccine at some point this year -Continue with annual mammogram -Cologuard negative last year -She declines osteoporosis therapies and thus declines further DEXA -Discussed importance of regular weightbearing exercise and daily calcium and vitamin D -Obtain screening labs as above  No orders of the defined types were placed in this encounter.   Follow-up: No follow-ups on file.    Carolann Littler, MD

## 2021-10-14 ENCOUNTER — Other Ambulatory Visit: Payer: Self-pay | Admitting: Family Medicine

## 2021-10-14 DIAGNOSIS — E785 Hyperlipidemia, unspecified: Secondary | ICD-10-CM

## 2021-11-11 ENCOUNTER — Other Ambulatory Visit: Payer: Self-pay | Admitting: Family Medicine

## 2021-11-11 DIAGNOSIS — E785 Hyperlipidemia, unspecified: Secondary | ICD-10-CM

## 2021-12-07 DIAGNOSIS — L821 Other seborrheic keratosis: Secondary | ICD-10-CM | POA: Diagnosis not present

## 2021-12-07 DIAGNOSIS — L438 Other lichen planus: Secondary | ICD-10-CM | POA: Diagnosis not present

## 2021-12-15 ENCOUNTER — Other Ambulatory Visit: Payer: Self-pay | Admitting: Family Medicine

## 2021-12-15 DIAGNOSIS — E785 Hyperlipidemia, unspecified: Secondary | ICD-10-CM

## 2021-12-27 DIAGNOSIS — M810 Age-related osteoporosis without current pathological fracture: Secondary | ICD-10-CM | POA: Diagnosis not present

## 2021-12-27 DIAGNOSIS — C50911 Malignant neoplasm of unspecified site of right female breast: Secondary | ICD-10-CM | POA: Diagnosis not present

## 2021-12-27 DIAGNOSIS — E785 Hyperlipidemia, unspecified: Secondary | ICD-10-CM | POA: Diagnosis not present

## 2021-12-27 DIAGNOSIS — Z1231 Encounter for screening mammogram for malignant neoplasm of breast: Secondary | ICD-10-CM | POA: Diagnosis not present

## 2021-12-28 ENCOUNTER — Telehealth: Payer: Self-pay | Admitting: Family Medicine

## 2021-12-28 NOTE — Telephone Encounter (Signed)
Spoke with patient to schedule Medicare Annual Wellness Visit (AWV) either virtually or in office.   She stated she moved to [facility] and will have this done with new provider   Last AWV 01/13/21 ; please schedule at anytime with [facility] Nurse Health Advisor 1 or 2   This should be a 45 minute visit.

## 2021-12-31 ENCOUNTER — Other Ambulatory Visit: Payer: Self-pay | Admitting: Family Medicine

## 2021-12-31 DIAGNOSIS — E785 Hyperlipidemia, unspecified: Secondary | ICD-10-CM

## 2022-02-14 ENCOUNTER — Telehealth: Payer: Self-pay | Admitting: Family Medicine

## 2022-02-14 NOTE — Telephone Encounter (Signed)
Left message for patient to call back and schedule Medicare Annual Wellness Visit (AWV) either virtually or in office. Left  my Herbie Drape number [phone] ? ? ?Last AWV ;01/13/21   ?please schedule at anytime with [facility] Nurse Health Advisor 1 or 2 ? ? ?This should be a 45 minute visit.  ?

## 2022-02-14 NOTE — Telephone Encounter (Signed)
Please disregard call ?On 12/28/21 phone note   states pt moved to [facility] ?

## 2022-04-30 DIAGNOSIS — Z1231 Encounter for screening mammogram for malignant neoplasm of breast: Secondary | ICD-10-CM | POA: Diagnosis not present

## 2022-04-30 DIAGNOSIS — Z853 Personal history of malignant neoplasm of breast: Secondary | ICD-10-CM | POA: Diagnosis not present

## 2022-06-25 DIAGNOSIS — M810 Age-related osteoporosis without current pathological fracture: Secondary | ICD-10-CM | POA: Diagnosis not present

## 2022-06-25 DIAGNOSIS — E785 Hyperlipidemia, unspecified: Secondary | ICD-10-CM | POA: Diagnosis not present

## 2022-06-25 DIAGNOSIS — M7702 Medial epicondylitis, left elbow: Secondary | ICD-10-CM | POA: Diagnosis not present

## 2022-10-24 DIAGNOSIS — D2262 Melanocytic nevi of left upper limb, including shoulder: Secondary | ICD-10-CM | POA: Diagnosis not present

## 2022-10-24 DIAGNOSIS — L821 Other seborrheic keratosis: Secondary | ICD-10-CM | POA: Diagnosis not present

## 2022-10-24 DIAGNOSIS — L814 Other melanin hyperpigmentation: Secondary | ICD-10-CM | POA: Diagnosis not present

## 2022-10-24 DIAGNOSIS — D2261 Melanocytic nevi of right upper limb, including shoulder: Secondary | ICD-10-CM | POA: Diagnosis not present

## 2022-12-27 DIAGNOSIS — E785 Hyperlipidemia, unspecified: Secondary | ICD-10-CM | POA: Diagnosis not present

## 2023-05-20 DIAGNOSIS — Z1231 Encounter for screening mammogram for malignant neoplasm of breast: Secondary | ICD-10-CM | POA: Diagnosis not present

## 2023-05-20 DIAGNOSIS — Z853 Personal history of malignant neoplasm of breast: Secondary | ICD-10-CM | POA: Diagnosis not present

## 2023-06-27 DIAGNOSIS — M5442 Lumbago with sciatica, left side: Secondary | ICD-10-CM | POA: Diagnosis not present

## 2023-06-27 DIAGNOSIS — E785 Hyperlipidemia, unspecified: Secondary | ICD-10-CM | POA: Diagnosis not present

## 2023-06-27 DIAGNOSIS — Z0000 Encounter for general adult medical examination without abnormal findings: Secondary | ICD-10-CM | POA: Diagnosis not present

## 2023-06-27 DIAGNOSIS — M7632 Iliotibial band syndrome, left leg: Secondary | ICD-10-CM | POA: Diagnosis not present

## 2023-06-27 DIAGNOSIS — G8929 Other chronic pain: Secondary | ICD-10-CM | POA: Diagnosis not present

## 2023-10-19 DIAGNOSIS — Z1211 Encounter for screening for malignant neoplasm of colon: Secondary | ICD-10-CM | POA: Diagnosis not present

## 2023-10-31 DIAGNOSIS — L821 Other seborrheic keratosis: Secondary | ICD-10-CM | POA: Diagnosis not present

## 2023-10-31 DIAGNOSIS — L853 Xerosis cutis: Secondary | ICD-10-CM | POA: Diagnosis not present

## 2023-10-31 DIAGNOSIS — L814 Other melanin hyperpigmentation: Secondary | ICD-10-CM | POA: Diagnosis not present

## 2023-10-31 DIAGNOSIS — D225 Melanocytic nevi of trunk: Secondary | ICD-10-CM | POA: Diagnosis not present
